# Patient Record
Sex: Female | Born: 1990 | Race: Black or African American | Hispanic: No | Marital: Married | Smoking: Current every day smoker
Health system: Southern US, Community
[De-identification: ages and names within clinical notes are randomized; demographics above are authoritative.]

## PROBLEM LIST (undated history)

## (undated) DIAGNOSIS — F341 Dysthymic disorder: Secondary | ICD-10-CM

## (undated) DIAGNOSIS — I10 Essential (primary) hypertension: Secondary | ICD-10-CM

## (undated) DIAGNOSIS — R918 Other nonspecific abnormal finding of lung field: Secondary | ICD-10-CM

## (undated) DIAGNOSIS — D649 Anemia, unspecified: Secondary | ICD-10-CM

## (undated) DIAGNOSIS — G43909 Migraine, unspecified, not intractable, without status migrainosus: Secondary | ICD-10-CM

## (undated) DIAGNOSIS — R51 Headache: Secondary | ICD-10-CM

## (undated) DIAGNOSIS — J309 Allergic rhinitis, unspecified: Secondary | ICD-10-CM

## (undated) DIAGNOSIS — F1910 Other psychoactive substance abuse, uncomplicated: Secondary | ICD-10-CM

## (undated) DIAGNOSIS — F319 Bipolar disorder, unspecified: Secondary | ICD-10-CM

## (undated) DIAGNOSIS — F331 Major depressive disorder, recurrent, moderate: Secondary | ICD-10-CM

## (undated) DIAGNOSIS — L83 Acanthosis nigricans: Secondary | ICD-10-CM

## (undated) DIAGNOSIS — J45991 Cough variant asthma: Secondary | ICD-10-CM

## (undated) DIAGNOSIS — F410 Panic disorder [episodic paroxysmal anxiety] without agoraphobia: Secondary | ICD-10-CM

## (undated) DIAGNOSIS — E669 Obesity, unspecified: Secondary | ICD-10-CM

## (undated) HISTORY — DX: Migraine, unspecified, not intractable, without status migrainosus: G43.909

## (undated) HISTORY — DX: Obesity, unspecified: E66.9

## (undated) HISTORY — DX: Other psychoactive substance abuse, uncomplicated: F19.10

## (undated) HISTORY — PX: TONSILLECTOMY: SUR1361

## (undated) HISTORY — DX: Headache: R51

## (undated) HISTORY — DX: Cough variant asthma: J45.991

## (undated) HISTORY — DX: Anemia, unspecified: D64.9

## (undated) HISTORY — DX: Bipolar disorder, unspecified: F31.9

## (undated) HISTORY — DX: Allergic rhinitis, unspecified: J30.9

## (undated) HISTORY — DX: Dysthymic disorder: F34.1

## (undated) HISTORY — DX: Major depressive disorder, recurrent, moderate: F33.1

## (undated) HISTORY — DX: Acanthosis nigricans: L83

## (undated) HISTORY — DX: Essential (primary) hypertension: I10

## (undated) HISTORY — DX: Other nonspecific abnormal finding of lung field: R91.8

## (undated) HISTORY — DX: Panic disorder (episodic paroxysmal anxiety): F41.0

---
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
## 2010-01-15 ENCOUNTER — Ambulatory Visit: Payer: Self-pay | Admitting: Family Medicine

## 2010-05-04 ENCOUNTER — Emergency Department: Payer: Self-pay | Admitting: Emergency Medicine

## 2010-06-03 ENCOUNTER — Emergency Department: Payer: Self-pay | Admitting: Emergency Medicine

## 2010-07-21 ENCOUNTER — Emergency Department: Payer: Self-pay | Admitting: Emergency Medicine

## 2010-10-30 ENCOUNTER — Emergency Department: Payer: Self-pay | Admitting: Emergency Medicine

## 2010-11-09 ENCOUNTER — Emergency Department: Payer: Self-pay | Admitting: Emergency Medicine

## 2011-02-11 ENCOUNTER — Ambulatory Visit: Payer: Self-pay

## 2011-09-21 ENCOUNTER — Emergency Department: Payer: Self-pay | Admitting: *Deleted

## 2011-09-21 ENCOUNTER — Emergency Department: Payer: Self-pay | Admitting: Emergency Medicine

## 2011-09-22 ENCOUNTER — Emergency Department: Payer: Self-pay | Admitting: Emergency Medicine

## 2012-01-15 ENCOUNTER — Emergency Department: Payer: Self-pay | Admitting: Emergency Medicine

## 2012-09-29 ENCOUNTER — Emergency Department: Payer: Self-pay | Admitting: Internal Medicine

## 2012-10-23 ENCOUNTER — Emergency Department: Payer: Self-pay | Admitting: Emergency Medicine

## 2012-11-11 ENCOUNTER — Emergency Department: Payer: Self-pay | Admitting: Emergency Medicine

## 2013-04-25 ENCOUNTER — Emergency Department: Payer: Self-pay | Admitting: Internal Medicine

## 2013-04-25 LAB — URINALYSIS, COMPLETE
BACTERIA: NONE SEEN
BILIRUBIN, UR: NEGATIVE
Glucose,UR: NEGATIVE mg/dL (ref 0–75)
Ketone: NEGATIVE
LEUKOCYTE ESTERASE: NEGATIVE
Nitrite: NEGATIVE
PROTEIN: NEGATIVE
Ph: 5 (ref 4.5–8.0)
Specific Gravity: 1.02 (ref 1.003–1.030)
Squamous Epithelial: 7
WBC UR: 2 /HPF (ref 0–5)

## 2013-04-25 LAB — GC/CHLAMYDIA PROBE AMP

## 2013-04-25 LAB — WET PREP, GENITAL

## 2013-08-28 ENCOUNTER — Emergency Department: Payer: Self-pay | Admitting: Emergency Medicine

## 2013-09-10 ENCOUNTER — Ambulatory Visit: Payer: Self-pay | Admitting: Cardiovascular Disease

## 2013-09-26 ENCOUNTER — Encounter: Payer: Self-pay | Admitting: *Deleted

## 2013-09-27 ENCOUNTER — Encounter (INDEPENDENT_AMBULATORY_CARE_PROVIDER_SITE_OTHER): Payer: Self-pay

## 2013-09-27 ENCOUNTER — Encounter: Payer: Self-pay | Admitting: Cardiovascular Disease

## 2013-09-27 ENCOUNTER — Ambulatory Visit (INDEPENDENT_AMBULATORY_CARE_PROVIDER_SITE_OTHER): Payer: Medicaid Other | Admitting: Cardiovascular Disease

## 2013-09-27 VITALS — BP 138/94 | HR 57 | Ht 64.0 in | Wt 281.2 lb

## 2013-09-27 DIAGNOSIS — R002 Palpitations: Secondary | ICD-10-CM | POA: Insufficient documentation

## 2013-09-27 NOTE — Assessment & Plan Note (Signed)
The symptoms are overall mild and could be due to premature beats. I requested a 48-hour Holter monitor. I advised her to decrease caffeine intake. If treatment of elevated blood pressure is needed, she might benefit from a beta blocker or calcium channel blocker for symptomatic relief.

## 2013-09-27 NOTE — Patient Instructions (Signed)
Your physician has recommended that you wear a holter monitor. Holter monitors are medical devices that record the heart's electrical activity. Doctors most often use these monitors to diagnose arrhythmias. Arrhythmias are problems with the speed or rhythm of the heartbeat. The monitor is a small, portable device. You can wear one while you do your normal daily activities. This is usually used to diagnose what is causing palpitations/syncope (passing out).   Your physician recommends that you schedule a follow-up appointment in:  As needed    

## 2013-09-27 NOTE — Progress Notes (Signed)
Primary care physician: Dr. Carlynn Purl  HPI  This is a 23 year old African American female who was referred for evaluation of palpitations. She has no previous cardiac history. She has chronic medical conditions that include elevated blood pressure without hypertension, obesity, depression and anxiety. She reports recent episodes of palpitations associated with mild dizziness but no significant tachycardia. These happened on a daily basis. She denies any previous syncopal episodes. She has occasional mild chest discomfort at rest and not with physical activities. She does drink significant amounts of soda and tea. She does not smoke. She does admit to using marijuana almost daily but no cocaine use. She drinks alcohol occasionally. There is no family history of sudden death, premature coronary artery disease or arrhythmia.     Allergies  Allergen Reactions  . Depakote [Divalproex Sodium]   . Tegretol [Carbamazepine]      No current outpatient prescriptions on file prior to visit.   No current facility-administered medications on file prior to visit.     Past Medical History  Diagnosis Date  . Other nonspecific abnormal finding of lung field   . Acquired acanthosis nigricans   . Dysthymic disorder   . Obesity, unspecified   . Headache(784.0)   . Essential hypertension, benign   . Bipolar disorder, unspecified   . Anemia, unspecified   . Migraine, unspecified, without mention of intractable migraine without mention of status migrainosus   . Allergic rhinitis, cause unspecified   . Cough variant asthma      Past Surgical History  Procedure Laterality Date  . Cesarean section    . Tonsillectomy       Family History  Problem Relation Age of Onset  . Hypertension Mother   . Leukemia Mother   . Diabetes Mother   . Heart murmur Mother   . Heart attack Mother   . Heart failure Mother      History   Social History  . Marital Status: Single    Spouse Name: N/A    Number  of Children: N/A  . Years of Education: N/A   Occupational History  . Not on file.   Social History Main Topics  . Smoking status: Never Smoker   . Smokeless tobacco: Not on file  . Alcohol Use: Yes  . Drug Use: Yes    Special: Marijuana  . Sexual Activity: Not on file   Other Topics Concern  . Not on file   Social History Narrative  . No narrative on file     ROS A 10 point review of system was performed. It is negative other than that mentioned in the history of present illness.   PHYSICAL EXAM   BP 138/94  Pulse 57  Ht 5\' 4"  (1.626 m)  Wt 281 lb 4 oz (127.574 kg)  BMI 48.25 kg/m2 Constitutional: She is oriented to person, place, and time. She appears well-developed and well-nourished. No distress.  HENT: No nasal discharge.  Head: Normocephalic and atraumatic.  Eyes: Pupils are equal and round. No discharge.  Neck: Normal range of motion. Neck supple. No JVD present. No thyromegaly present.  Cardiovascular: Normal rate, regular rhythm, normal heart sounds. Exam reveals no gallop and no friction rub. No murmur heard.  Pulmonary/Chest: Effort normal and breath sounds normal. No stridor. No respiratory distress. She has no wheezes. She has no rales. She exhibits no tenderness.  Abdominal: Soft. Bowel sounds are normal. She exhibits no distension. There is no tenderness. There is no rebound and no guarding.  Musculoskeletal:  Normal range of motion. She exhibits no edema and no tenderness.  Neurological: She is alert and oriented to person, place, and time. Coordination normal.  Skin: Skin is warm and dry. No rash noted. She is not diaphoretic. No erythema. No pallor.  Psychiatric: She has a normal mood and affect. Her behavior is normal. Judgment and thought content normal.     ZOX:WRUEAEKG:Sinus  Bradycardia  -with sinus arrhythmia WITHIN NORMAL LIMITS   ASSESSMENT AND PLAN

## 2013-10-08 ENCOUNTER — Telehealth: Payer: Self-pay | Admitting: *Deleted

## 2013-10-08 NOTE — Telephone Encounter (Signed)
Patient called and hasn't heard from [facility] for her heart monitor.

## 2013-10-10 NOTE — Telephone Encounter (Signed)
Patient states [facility] called her and she has appt to get monitor 10/11/13

## 2013-10-14 ENCOUNTER — Emergency Department: Payer: Self-pay | Admitting: Student

## 2013-10-14 LAB — COMPREHENSIVE METABOLIC PANEL
ALBUMIN: 3.6 g/dL (ref 3.4–5.0)
ALT: 15 U/L
AST: 24 U/L (ref 15–37)
Alkaline Phosphatase: 59 U/L
Anion Gap: 9 (ref 7–16)
BUN: 10 mg/dL (ref 7–18)
Bilirubin,Total: 0.3 mg/dL (ref 0.2–1.0)
CALCIUM: 8.7 mg/dL (ref 8.5–10.1)
CHLORIDE: 106 mmol/L (ref 98–107)
Co2: 25 mmol/L (ref 21–32)
Creatinine: 0.83 mg/dL (ref 0.60–1.30)
EGFR (Non-African Amer.): >60
GLUCOSE: 90 mg/dL (ref 65–99)
Osmolality: 278 (ref 275–301)
POTASSIUM: 3.6 mmol/L (ref 3.5–5.1)
Sodium: 140 mmol/L (ref 136–145)
TOTAL PROTEIN: 7.9 g/dL (ref 6.4–8.2)

## 2013-10-14 LAB — PROTIME-INR
INR: 1
PROTHROMBIN TIME: 12.7 s (ref 11.5–14.7)

## 2013-10-14 LAB — URINALYSIS, COMPLETE
Bacteria: NONE SEEN
Bilirubin,UR: NEGATIVE
Blood: NEGATIVE
Glucose,UR: NEGATIVE mg/dL (ref 0–75)
Ketone: NEGATIVE
Leukocyte Esterase: NEGATIVE
Nitrite: NEGATIVE
Ph: 5 (ref 4.5–8.0)
Protein: NEGATIVE
Specific Gravity: 1.006 (ref 1.003–1.030)

## 2013-10-14 LAB — CBC
HCT: 34.9 % — AB (ref 35.0–47.0)
HGB: 11.3 g/dL — ABNORMAL LOW (ref 12.0–16.0)
MCH: 25 pg — ABNORMAL LOW (ref 26.0–34.0)
MCHC: 32.4 g/dL (ref 32.0–36.0)
MCV: 77 fL — AB (ref 80–100)
Platelet: 334 10*3/uL (ref 150–440)
RBC: 4.53 10*6/uL (ref 3.80–5.20)
RDW: 16.8 % — ABNORMAL HIGH (ref 11.5–14.5)
WBC: 9.3 10*3/uL (ref 3.6–11.0)

## 2013-10-14 LAB — TROPONIN I

## 2013-10-14 LAB — PREGNANCY, URINE: Pregnancy Test, Urine: NEGATIVE m[IU]/mL

## 2013-10-14 LAB — CK TOTAL AND CKMB (NOT AT ARMC)
CK, Total: 117 U/L
CK-MB: <0.5 ng/mL — ABNORMAL LOW (ref 0.5–3.6)

## 2013-10-15 ENCOUNTER — Ambulatory Visit (INDEPENDENT_AMBULATORY_CARE_PROVIDER_SITE_OTHER): Payer: Self-pay | Admitting: Cardiovascular Disease

## 2013-10-15 ENCOUNTER — Encounter: Payer: Self-pay | Admitting: Cardiovascular Disease

## 2013-10-15 VITALS — BP 142/92 | HR 93 | Ht 64.0 in | Wt 277.2 lb

## 2013-10-15 DIAGNOSIS — R0602 Shortness of breath: Secondary | ICD-10-CM

## 2013-10-15 DIAGNOSIS — R002 Palpitations: Secondary | ICD-10-CM

## 2013-10-15 NOTE — Patient Instructions (Signed)
Your physician has requested that you have an echocardiogram. Echocardiography is a painless test that uses sound waves to create images of your heart. It provides your doctor with information about the size and shape of your heart and how well your heart's chambers and valves are working. This procedure takes approximately one hour. There are no restrictions for this procedure.  Please proceed with obtaining your holter monitor   Your physician recommends that you schedule a follow-up appointment in:  After your echo

## 2013-10-15 NOTE — Progress Notes (Signed)
Primary care physician: Dr. Carlynn PurlSowles  HPI  This is a 23 year old African American female who is here today for followup visit regarding palpitations. She has no previous cardiac history. She has chronic medical conditions that include elevated blood pressure without hypertension, obesity, depression and anxiety. She was seen recently for episodes of palpitations associated with mild dizziness but no significant tachycardia. No previous syncopal episodes. She also had mild atypical chest pain and exertional dyspnea. I requested a 48-hour Holter monitor but she did not keep the appointment. She had worsening palpitations yesterday and [facility]. Her palpitations have been after she used cocaine. Labs were unremarkable except for mild anemia. Chest x-ray showed no acute cardiopulmonary process.   Allergies  Allergen Reactions  . Depakote [Divalproex Sodium]   . Tegretol [Carbamazepine]      No current outpatient prescriptions on file prior to visit.   No current facility-administered medications on file prior to visit.     Past Medical History  Diagnosis Date  . Other nonspecific abnormal finding of lung field   . Acquired acanthosis nigricans   . Dysthymic disorder   . Obesity, unspecified   . Headache(784.0)   . Essential hypertension, benign   . Bipolar disorder, unspecified   . Anemia, unspecified   . Migraine, unspecified, without mention of intractable migraine without mention of status migrainosus   . Allergic rhinitis, cause unspecified   . Cough variant asthma      Past Surgical History  Procedure Laterality Date  . Cesarean section    . Tonsillectomy       Family History  Problem Relation Age of Onset  . Hypertension Mother   . Leukemia Mother   . Diabetes Mother   . Heart murmur Mother   . Heart attack Mother   . Heart failure Mother      History   Social History  . Marital Status: Single    Spouse Name: N/A    Number of  Children: N/A  . Years of Education: N/A   Occupational History  . Not on file.   Social History Main Topics  . Smoking status: Never Smoker   . Smokeless tobacco: Not on file  . Alcohol Use: No  . Drug Use: Yes    Special: Marijuana, Cocaine  . Sexual Activity: Not on file   Other Topics Concern  . Not on file   Social History Narrative  . No narrative on file     ROS A 10 point review of system was performed. It is negative other than that mentioned in the history of present illness.   PHYSICAL EXAM   BP 142/92  Pulse 93  Ht 5\' 4"  (1.626 m)  Wt 277 lb 4 oz (125.76 kg)  BMI 47.57 kg/m2 Constitutional: She is oriented to person, place, and time. She appears well-developed and well-nourished. No distress.  HENT: No nasal discharge.  Head: Normocephalic and atraumatic.  Eyes: Pupils are equal and round. No discharge.  Neck: Normal range of motion. Neck supple. No JVD present. No thyromegaly present.  Cardiovascular: Normal rate, regular rhythm, normal heart sounds. Exam reveals no gallop and no friction rub. No murmur heard.  Pulmonary/Chest: Effort normal and breath sounds normal. No stridor. No respiratory distress. She has no wheezes. She has no rales. She exhibits no tenderness.  Abdominal: Soft. Bowel sounds are normal. She exhibits no distension. There is no tenderness. There is no rebound and no guarding.  Musculoskeletal: Normal range of  motion. She exhibits no edema and no tenderness.  Neurological: She is alert and oriented to person, place, and time. Coordination normal.  Skin: Skin is warm and dry. No rash noted. She is not diaphoretic. No erythema. No pallor.  Psychiatric: She has a normal mood and affect. Her behavior is normal. Judgment and thought content normal.     WJX:BJYNW  Rhythm  -  Nonspecific T-abnormality  -Nondiagnostic for age.   ABNORMAL    ASSESSMENT AND PLAN

## 2013-10-16 DIAGNOSIS — R0602 Shortness of breath: Secondary | ICD-10-CM | POA: Insufficient documentation

## 2013-10-16 DIAGNOSIS — R002 Palpitations: Secondary | ICD-10-CM

## 2013-10-16 NOTE — Assessment & Plan Note (Addendum)
The most recent episode was in the setting of cocaine use. I had a prolonged discussion with the patient about the importance of avoiding recreational drugs especially cocaine. I reordered a 48-hour Holter monitor. Given elevated blood pressure, it might consider treatment with diltiazem.

## 2013-10-16 NOTE — Assessment & Plan Note (Signed)
The patient is complaining of worsening dyspnea and occasional chest discomfort. I requested an echocardiogram for evaluation.

## 2013-10-18 ENCOUNTER — Other Ambulatory Visit (INDEPENDENT_AMBULATORY_CARE_PROVIDER_SITE_OTHER): Payer: Self-pay

## 2013-10-18 ENCOUNTER — Other Ambulatory Visit: Payer: Self-pay

## 2013-10-18 DIAGNOSIS — R079 Chest pain, unspecified: Secondary | ICD-10-CM

## 2013-10-18 DIAGNOSIS — R0602 Shortness of breath: Secondary | ICD-10-CM

## 2013-10-18 DIAGNOSIS — I059 Rheumatic mitral valve disease, unspecified: Secondary | ICD-10-CM

## 2013-10-19 ENCOUNTER — Telehealth: Payer: Self-pay

## 2013-10-19 ENCOUNTER — Encounter: Payer: Self-pay | Admitting: *Deleted

## 2013-10-19 NOTE — Telephone Encounter (Signed)
Pt needs a note stating she was here on 10/18/2013 please fax to her school  [phone]

## 2013-10-19 NOTE — Telephone Encounter (Signed)
Faxed note as requested  LVM to inform patient

## 2013-10-22 ENCOUNTER — Other Ambulatory Visit: Payer: Self-pay

## 2013-10-22 ENCOUNTER — Telehealth: Payer: Self-pay | Admitting: *Deleted

## 2013-10-22 ENCOUNTER — Ambulatory Visit (INDEPENDENT_AMBULATORY_CARE_PROVIDER_SITE_OTHER): Payer: Self-pay

## 2013-10-22 DIAGNOSIS — R002 Palpitations: Secondary | ICD-10-CM

## 2013-10-22 NOTE — Telephone Encounter (Signed)
Informed patient that per Dr Kirke Corin her holter showed normal sinus rhythm with rare pvc's Dr. Kirke Corin stated it was all right to cancel her appt this week  Patient verbalized understanding  Instructed patient to call if she experiences worsening pvc's

## 2013-10-25 ENCOUNTER — Ambulatory Visit: Payer: Self-pay | Admitting: Cardiovascular Disease

## 2014-01-24 ENCOUNTER — Emergency Department: Payer: Self-pay | Admitting: Emergency Medicine

## 2014-01-31 ENCOUNTER — Emergency Department: Payer: Self-pay | Admitting: Emergency Medicine

## 2014-02-18 ENCOUNTER — Emergency Department: Payer: Self-pay | Admitting: Emergency Medicine

## 2014-02-21 LAB — BETA STREP CULTURE(ARMC)

## 2014-02-23 ENCOUNTER — Emergency Department: Payer: Self-pay | Admitting: Emergency Medicine

## 2014-05-27 ENCOUNTER — Emergency Department: Payer: Self-pay | Admitting: Emergency Medicine

## 2014-06-27 ENCOUNTER — Emergency Department: Admit: 2014-06-27 | Disposition: A | Discharge status: Home / Self Care | Payer: Self-pay | Admitting: Student

## 2014-08-15 ENCOUNTER — Ambulatory Visit: Payer: Medicaid Other | Admitting: Psychiatry

## 2014-09-03 ENCOUNTER — Encounter: Payer: Self-pay | Admitting: Family Medicine

## 2014-09-03 DIAGNOSIS — F410 Panic disorder [episodic paroxysmal anxiety] without agoraphobia: Secondary | ICD-10-CM | POA: Insufficient documentation

## 2014-09-03 DIAGNOSIS — F331 Major depressive disorder, recurrent, moderate: Secondary | ICD-10-CM | POA: Insufficient documentation

## 2014-09-03 DIAGNOSIS — Z8639 Personal history of other endocrine, nutritional and metabolic disease: Secondary | ICD-10-CM | POA: Insufficient documentation

## 2014-09-03 DIAGNOSIS — Z8679 Personal history of other diseases of the circulatory system: Secondary | ICD-10-CM | POA: Insufficient documentation

## 2014-09-05 ENCOUNTER — Ambulatory Visit: Payer: Self-pay | Admitting: Family Medicine

## 2014-09-10 ENCOUNTER — Other Ambulatory Visit: Payer: Self-pay | Admitting: Family Medicine

## 2014-09-10 NOTE — Telephone Encounter (Signed)
Was she dismissed from our practice?

## 2014-09-10 NOTE — Telephone Encounter (Signed)
Pt has no showed several appointments

## 2014-09-11 ENCOUNTER — Encounter: Payer: Self-pay | Admitting: Family Medicine

## 2014-09-24 ENCOUNTER — Encounter: Payer: Self-pay | Admitting: *Deleted

## 2014-09-24 ENCOUNTER — Emergency Department
Admission: EM | Admit: 2014-09-24 | Discharge: 2014-09-24 | Disposition: A | Discharge status: Home / Self Care | Payer: Medicaid Other | Attending: Emergency Medicine | Admitting: Emergency Medicine

## 2014-09-24 DIAGNOSIS — J029 Acute pharyngitis, unspecified: Secondary | ICD-10-CM | POA: Diagnosis not present

## 2014-09-24 DIAGNOSIS — Z79899 Other long term (current) drug therapy: Secondary | ICD-10-CM | POA: Insufficient documentation

## 2014-09-24 DIAGNOSIS — I10 Essential (primary) hypertension: Secondary | ICD-10-CM | POA: Diagnosis not present

## 2014-09-24 MED ORDER — AZITHROMYCIN 250 MG PO TABS: ORAL_TABLET | ORAL | Status: DC

## 2014-09-24 MED ORDER — ACETAMINOPHEN-CODEINE #3 300-30 MG PO TABS: 1.0000 | ORAL_TABLET | ORAL | Status: DC | PRN

## 2014-09-24 NOTE — Discharge Instructions (Signed)
Follow up with the primary care provider for symptoms that are not improving over the next few days. Return to the emergency department for symptoms that change or worsen if unable to schedule an appointment.

## 2014-09-24 NOTE — ED Provider Notes (Signed)
[facility] Emergency Department Provider Note  ____________________________________________  Time seen: Approximately 9:38 PM  I have reviewed the triage vital signs and the nursing notes.   HISTORY  Chief Complaint Sore Throat and Otalgia    HPI Jeanette Alexander is a 24 y.o. female presents to the emergency department for bilateral earache and sore throat for the last 8 days. She's had no relief with over-the-counter medications. She reports feeling chills but denies fever. She denies nausea vomiting or diarrhea.   Past Medical History  Diagnosis Date  . Other nonspecific abnormal finding of lung field   . Acquired acanthosis nigricans   . Dysthymic disorder   . Obesity, unspecified   . Headache(784.0)   . Essential hypertension, benign   . Bipolar disorder, unspecified   . Anemia, unspecified   . Migraine, unspecified, without mention of intractable migraine without mention of status migrainosus   . Allergic rhinitis, cause unspecified   . Cough variant asthma   . Panic disorder   . Major depressive disorder, recurrent episode, moderate     Patient Active Problem List   Diagnosis Date Noted  . H/O: HTN (hypertension) 09/03/2014  . Depression, major, recurrent, moderate 09/03/2014  . H/O: obesity 09/03/2014  . Episodic paroxysmal anxiety disorder 09/03/2014  . SOB (shortness of breath) 10/16/2013  . Heart palpitations 09/27/2013    Past Surgical History  Procedure Laterality Date  . Cesarean section    . Tonsillectomy      Current Outpatient Rx  Name  Route  Sig  Dispense  Refill  . acetaminophen-codeine (TYLENOL #3) 300-30 MG per tablet   Oral   Take 1-2 tablets by mouth every 4 (four) hours as needed for moderate pain.   12 tablet   0   . azithromycin (ZITHROMAX Z-PAK) 250 MG tablet      Take 2 tablets (500 mg) on  Day 1,  followed by 1 tablet (250 mg) once daily on Days 2 through 5.   6 each   0   . busPIRone (BUSPAR) 10 MG  tablet   Oral   Take 10 mg by mouth 3 (three) times daily.         . clonazePAM (KLONOPIN) 0.5 MG tablet   Oral   Take 0.5 mg by mouth 2 (two) times daily as needed for anxiety.         . hydrochlorothiazide (HYDRODIURIL) 12.5 MG tablet      TAKE 1 TABLET BY MOUTH IN THE MORNING FOR BLOOD PRESSURE   30 tablet   0   . metoprolol succinate (TOPROL-XL) 25 MG 24 hr tablet      TAKE 1 TABLET BY MOUTH EVERY DAY FOR BLOOD PRESSURE AND PALPITATIONS   30 tablet   0   . traZODone (DESYREL) 100 MG tablet   Oral   Take 50 mg by mouth at bedtime.           Allergies Depakote and Tegretol  Family History  Problem Relation Age of Onset  . Hypertension Mother   . Leukemia Mother   . Diabetes Mother   . Heart murmur Mother   . Heart attack Mother   . Heart failure Mother     Social History History  Substance Use Topics  . Smoking status: Never Smoker   . Smokeless tobacco: Not on file  . Alcohol Use: No    Review of Systems Constitutional:Feverno Eyes: No visual changes. ENT: Sore throat.yes, Difficulty Swallowing no Respiratory: Denies shortness  of breath. Gastrointestinal: No abdominal pain.  No nausea, no vomiting.  No diarrhea. Genitourinary: Negative for dysuria. Musculoskeletal:Generalized body aches: yes Skin: Rash: no  Neurological: Negative for headaches, focal weakness or numbness.  10-point ROS otherwise negative.  ____________________________________________   PHYSICAL EXAM:  VITAL SIGNS: ED Triage Vitals  Enc Vitals Group     BP 09/24/14 2126 120/51 mmHg     Pulse Rate 09/24/14 2126 88     Resp 09/24/14 2126 18     Temp 09/24/14 2126 98.4 F (36.9 C)     Temp Source 09/24/14 2126 Oral     SpO2 09/24/14 2126 99 %     Weight 09/24/14 2126 309 lb (140.161 kg)     Height 09/24/14 2126 5\' 5"  (1.651 m)     Head Cir --      Peak Flow --      Pain Score 09/24/14 2127 9     Pain Loc --      Pain Edu? --      Excl. in GC? --      Constitutional: Alert and oriented. Well appearing and in no acute distress. Eyes: Conjunctivae are normal. PERRL. EOMI. Head: Atraumatic. Nose: No congestion/rhinnorhea. Mouth/Throat: Mucous membranes are moist.  Oropharynx erythematous. Tonsils are absent. Neck: No stridor.  Lymphatic: Lymphadenopathy: no. Cardiovascular: Normal rate, regular rhythm. Good peripheral circulation. Respiratory: Normal respiratory effort. Lungs CTAB. Gastrointestinal: Soft and nontender. Musculoskeletal: No lower extremity tenderness nor edema.   Neurologic:  Normal speech and language. No gross focal neurologic deficits are appreciated. Speech is normal. No gait instability. Skin:  Skin is warm, dry and intact. No rash noted Psychiatric: Mood and affect are normal. Speech and behavior are normal.  ____________________________________________   LABS (all labs ordered are listed, but only abnormal results are displayed)  Labs Reviewed - No data to display ____________________________________________  EKG   ____________________________________________  RADIOLOGY  Not indicated ____________________________________________   PROCEDURES  Procedure(s) performed: None  Critical Care performed: No  ____________________________________________   INITIAL IMPRESSION / ASSESSMENT AND PLAN / ED COURSE  Pertinent labs & imaging results that were available during my care of the patient were reviewed by me and considered in my medical decision making (see chart for details).  Patient was advised to follow-up with primary care provider for symptoms that are not improving over the next 2-3 days. She was advised to return to emergency department for symptoms that change or worsen if she is unable schedule an appointment. ____________________________________________   FINAL CLINICAL IMPRESSION(S) / ED DIAGNOSES  Final diagnoses:  Acute pharyngitis, unspecified pharyngitis type      Chinita Pester, FNP 09/24/14 2330  Myrna Blazer, MD 09/28/14 2212

## 2014-09-24 NOTE — ED Notes (Signed)
Pt has sore throat and bil earache for 1 week.

## 2014-10-09 ENCOUNTER — Emergency Department
Admission: EM | Admit: 2014-10-09 | Discharge: 2014-10-09 | Disposition: A | Discharge status: Home / Self Care | Payer: Medicaid Other | Attending: Emergency Medicine | Admitting: Emergency Medicine

## 2014-10-09 DIAGNOSIS — Z79899 Other long term (current) drug therapy: Secondary | ICD-10-CM | POA: Insufficient documentation

## 2014-10-09 DIAGNOSIS — Z9089 Acquired absence of other organs: Secondary | ICD-10-CM | POA: Diagnosis not present

## 2014-10-09 DIAGNOSIS — J029 Acute pharyngitis, unspecified: Secondary | ICD-10-CM | POA: Diagnosis present

## 2014-10-09 DIAGNOSIS — I10 Essential (primary) hypertension: Secondary | ICD-10-CM | POA: Diagnosis not present

## 2014-10-09 LAB — POCT RAPID STREP A: STREPTOCOCCUS, GROUP A SCREEN (DIRECT): NEGATIVE

## 2014-10-09 MED ORDER — FLUCONAZOLE 150 MG PO TABS: ORAL_TABLET | ORAL | Status: DC

## 2014-10-09 MED ORDER — AMOXICILLIN 500 MG PO CAPS: 500.0000 mg | ORAL_CAPSULE | Freq: Two times a day (BID) | ORAL | Status: AC

## 2014-10-09 MED ORDER — IBUPROFEN 400 MG PO TABS
600.0000 mg | ORAL_TABLET | Freq: Once | ORAL | Status: DC
  Filled 2014-10-09: qty 2

## 2014-10-09 MED ORDER — AMOXICILLIN 500 MG PO CAPS
500.0000 mg | ORAL_CAPSULE | Freq: Once | ORAL | Status: AC
  Administered 2014-10-09: 500 mg via ORAL
  Filled 2014-10-09: qty 1

## 2014-10-09 MED ORDER — IBUPROFEN 800 MG PO TABS
800.0000 mg | ORAL_TABLET | Freq: Once | ORAL | Status: AC
  Administered 2014-10-09: 800 mg via ORAL

## 2014-10-09 NOTE — ED Provider Notes (Addendum)
[facility] Emergency Department Provider Note  ____________________________________________  Time seen: Approximately 10:21 PM  I have reviewed the triage vital signs and the nursing notes.   HISTORY  Chief Complaint Sore Throat    HPI Jeanette Alexander is a 24 y.o. female with multiple chronic medical problems include a psychiatric disease but no severe medical conditions other than morbid obesity who presents with recurrent sore throat.  She states that she had a sore throat about 3 weeks ago that was quite severe and lasted about 8 days.  She was seen in the emergency department and prescribed azithromycin after which the sore throat improved rapidly.  It was gone for a while but returned 3 days ago and feels "just like it did before".  She is not having any difficulty swallowing, just pain with swallowing.  She is not having any difficulty breathing, chest pain, shortness of breath, fever or chills, nausea or vomiting, or abdominal pain.  She has previously seen a physician in [facility] and had her tonsils removed for problems with sore throats in the past.  She does endorse some recent nasal congestion and frequent sneezing.   Past Medical History  Diagnosis Date  . Other nonspecific abnormal finding of lung field   . Acquired acanthosis nigricans   . Dysthymic disorder   . Obesity, unspecified   . Headache(784.0)   . Essential hypertension, benign   . Bipolar disorder, unspecified   . Anemia, unspecified   . Migraine, unspecified, without mention of intractable migraine without mention of status migrainosus   . Allergic rhinitis, cause unspecified   . Cough variant asthma   . Panic disorder   . Major depressive disorder, recurrent episode, moderate     Patient Active Problem List   Diagnosis Date Noted  . H/O: HTN (hypertension) 09/03/2014  . Depression, major, recurrent, moderate 09/03/2014  . H/O: obesity 09/03/2014  . Episodic paroxysmal  anxiety disorder 09/03/2014  . SOB (shortness of breath) 10/16/2013  . Heart palpitations 09/27/2013    Past Surgical History  Procedure Laterality Date  . Cesarean section    . Tonsillectomy      Current Outpatient Rx  Name  Route  Sig  Dispense  Refill  . acetaminophen-codeine (TYLENOL #3) 300-30 MG per tablet   Oral   Take 1-2 tablets by mouth every 4 (four) hours as needed for moderate pain.   12 tablet   0   .           .           . busPIRone (BUSPAR) 10 MG tablet   Oral   Take 10 mg by mouth 3 (three) times daily.         . clonazePAM (KLONOPIN) 0.5 MG tablet   Oral   Take 0.5 mg by mouth 2 (two) times daily as needed for anxiety.         . hydrochlorothiazide (HYDRODIURIL) 12.5 MG tablet      TAKE 1 TABLET BY MOUTH IN THE MORNING FOR BLOOD PRESSURE   30 tablet   0   . metoprolol succinate (TOPROL-XL) 25 MG 24 hr tablet      TAKE 1 TABLET BY MOUTH EVERY DAY FOR BLOOD PRESSURE AND PALPITATIONS   30 tablet   0   . traZODone (DESYREL) 100 MG tablet   Oral   Take 50 mg by mouth at bedtime.           Allergies Depakote and Tegretol  Family History  Problem Relation Age of Onset  . Hypertension Mother   . Leukemia Mother   . Diabetes Mother   . Heart murmur Mother   . Heart attack Mother   . Heart failure Mother     Social History Social History  Substance Use Topics  . Smoking status: Never Smoker   . Smokeless tobacco: Not on file  . Alcohol Use: No    Review of Systems Constitutional: No fever/chills Eyes: No visual changes. ENT: Severe sharp sore throat Cardiovascular: Denies chest pain. Respiratory: Denies shortness of breath. Gastrointestinal: No abdominal pain.  No nausea, no vomiting.  No diarrhea.  No constipation. Genitourinary: Negative for dysuria. Musculoskeletal: Negative for back pain. Skin: Negative for rash. Neurological: Negative for headaches, focal weakness or numbness.  10-point ROS otherwise  negative.  ____________________________________________   PHYSICAL EXAM:  VITAL SIGNS: ED Triage Vitals  Enc Vitals Group     BP 10/09/14 2158 103/58 mmHg     Pulse Rate 10/09/14 2158 69     Resp 10/09/14 2158 18     Temp 10/09/14 2158 98 F (36.7 C)     Temp Source 10/09/14 2158 Oral     SpO2 10/09/14 2158 99 %     Weight 10/09/14 2158 314 lb (142.429 kg)     Height 10/09/14 2158  (1.626 m)     Head Cir --      Peak Flow --      Pain Score 10/09/14 2157 8     Pain Loc --      Pain Edu? --      Excl. in GC? --     Constitutional: Alert and oriented. Well appearing and in no acute distress. Eyes: Conjunctivae are normal. PERRL. EOMI. Head: Atraumatic. Nose: No congestion/rhinnorhea. Mouth/Throat: Mucous membranes are moist.  Tonsils are surgically absent.  She has erythema of her posterior pharynx and some petechiae of the posterior pharynx as well.  There is no exudate or purulence. No muffled or hot potato voice. Neck: No stridor.  No tenderness to palpation of the thyroid cartilage/larynx. Hematological/Lymphatic/Immunilogical: No cervical lymphadenopathy Cardiovascular: Normal rate, regular rhythm. Grossly normal heart sounds.  Good peripheral circulation. Respiratory: Normal respiratory effort.  No retractions. Lungs CTAB. Gastrointestinal: Soft and nontender. No distention. No abdominal bruits. No CVA tenderness. Musculoskeletal: No lower extremity tenderness nor edema.  No joint effusions. Neurologic:  Normal speech and language. No gross focal neurologic deficits are appreciated.  Skin:  Skin is warm, dry and intact. No rash noted. Psychiatric: Mood and affect are normal. Speech and behavior are normal.  ____________________________________________   LABS (all labs ordered are listed, but only abnormal results are displayed)  Labs Reviewed  POCT RAPID STREP A   ____________________________________________  EKG  not  indicated ____________________________________________  RADIOLOGY  No results found.  ____________________________________________   PROCEDURES  Procedure(s) performed: None  Critical Care performed: No ____________________________________________   INITIAL IMPRESSION / ASSESSMENT AND PLAN / ED COURSE  Pertinent labs & imaging results that were available during my care of the patient were reviewed by me and considered in my medical decision making (see chart for details).  The patient is well-appearing with normal vital signs and is afebrile.  Her rapid strep was negative, but she does have recurrent sore throats, and she has erythema and petechiae of her palate.  There is no evidence on physical exam of a retropharyngeal abscess, peritonsillar abscess, or Ludwig's angina.  Given her pain, the improvement previously with  antibiotics, and her physical exam findings, I will give her an empiric dose of amoxicillin.  I suggested to her that she follow-up with an ENT doctor given her recurrent issues and she understands and agrees.  I gave my usual and customary return precautions.  ____________________________________________  FINAL CLINICAL IMPRESSION(S) / ED DIAGNOSES  Final diagnoses:  Acute pharyngitis, unspecified pharyngitis type      NEW MEDICATIONS STARTED DURING THIS VISIT:  New Prescriptions   AMOXICILLIN (AMOXIL) 500 MG CAPSULE    Take 1 capsule (500 mg total) by mouth 2 (two) times daily.   FLUCONAZOLE (DIFLUCAN) 150 MG TABLET    Take 1 tablet (150 mg) by mouth once for symptoms of a yeast infection.  If you continue to have symptoms in 3 days, take another tablet (150 mg) by mouth.     Loleta Rose, MD 10/09/14 2240  Addendum: At discharge, the patient told the nurse that she gets a yeast infection and return if she takes amoxicillin and ask for medication for treatment.  I wrote her a prescription for Diflucan 150 mg 2 tablets.  Loleta Rose, MD 10/09/14  2251

## 2014-10-09 NOTE — Discharge Instructions (Signed)

## 2014-10-09 NOTE — ED Notes (Signed)
Patient ambulatory to triage with steady gait, without difficulty or distress noted; pt reports seen here for sore throat, rx zpak but continues to have sore throat

## 2014-10-13 LAB — CULTURE, GROUP A STREP (THRC)

## 2014-11-12 ENCOUNTER — Emergency Department
Admission: EM | Admit: 2014-11-12 | Discharge: 2014-11-12 | Disposition: A | Discharge status: Home / Self Care | Payer: Medicaid Other | Attending: Emergency Medicine | Admitting: Emergency Medicine

## 2014-11-12 DIAGNOSIS — I10 Essential (primary) hypertension: Secondary | ICD-10-CM | POA: Insufficient documentation

## 2014-11-12 DIAGNOSIS — J029 Acute pharyngitis, unspecified: Secondary | ICD-10-CM

## 2014-11-12 MED ORDER — PREDNISONE 50 MG PO TABS: ORAL_TABLET | ORAL | Status: DC

## 2014-11-12 MED ORDER — AMOXICILLIN 500 MG PO CAPS: 500.0000 mg | ORAL_CAPSULE | Freq: Three times a day (TID) | ORAL | Status: DC

## 2014-11-12 NOTE — ED Notes (Signed)
Pt complains of a sore throat and voice being horse for 1 week, pt denies any other symptoms

## 2014-11-12 NOTE — ED Notes (Signed)
Pt c/o sore throat for the past 2 weeks. Denies fever.

## 2014-11-12 NOTE — ED Provider Notes (Signed)
[facility] Emergency Department Provider Note     Time seen: ----------------------------------------- 3:05 PM on 11/12/2014 -----------------------------------------    I have reviewed the triage vital signs and the nursing notes.   HISTORY  Chief Complaint Sore Throat    HPI Jeanette Alexander is a 24 y.o. female who presents ER for sore throat and hoarse forceful last week and recent bilateral ear pain. She states she saw her primary care doctor yesterday they did prescribe her anything for her symptoms. She denies any other complaints.   Past Medical History  Diagnosis Date  . Other nonspecific abnormal finding of lung field   . Acquired acanthosis nigricans   . Dysthymic disorder   . Obesity, unspecified   . Headache(784.0)   . Essential hypertension, benign   . Bipolar disorder, unspecified   . Anemia, unspecified   . Migraine, unspecified, without mention of intractable migraine without mention of status migrainosus   . Allergic rhinitis, cause unspecified   . Cough variant asthma   . Panic disorder   . Major depressive disorder, recurrent episode, moderate     Patient Active Problem List   Diagnosis Date Noted  . H/O: HTN (hypertension) 09/03/2014  . Depression, major, recurrent, moderate 09/03/2014  . H/O: obesity 09/03/2014  . Episodic paroxysmal anxiety disorder 09/03/2014  . SOB (shortness of breath) 10/16/2013  . Heart palpitations 09/27/2013    Past Surgical History  Procedure Laterality Date  . Cesarean section    . Tonsillectomy      Allergies Depakote and Tegretol  Social History Social History  Substance Use Topics  . Smoking status: Never Smoker   . Smokeless tobacco: None  . Alcohol Use: Yes     Comment: occassionally    Review of Systems Constitutional: Negative for fever. Eyes: Negative for visual changes. ENT: Positive for bilateral earache and sore throat. Cardiovascular: Negative for chest  pain. Respiratory: Negative for shortness of breath. Gastrointestinal: Negative for abdominal pain, vomiting and diarrhea. Genitourinary: Negative for dysuria. Musculoskeletal: Negative for back pain. Skin: Negative for rash. Neurological: Negative for headaches, focal weakness or numbness.  10-point ROS otherwise negative.  ____________________________________________   PHYSICAL EXAM:  VITAL SIGNS: ED Triage Vitals  Enc Vitals Group     BP 11/12/14 1402 124/62 mmHg     Pulse Rate 11/12/14 1400 94     Resp 11/12/14 1400 18     Temp 11/12/14 1400 98.3 F (36.8 C)     Temp Source 11/12/14 1400 Oral     SpO2 11/12/14 1402 97 %     Weight 11/12/14 1400 312 lb (141.522 kg)     Height 11/12/14 1400 5\' 5"  (1.651 m)     Head Cir --      Peak Flow --      Pain Score 11/12/14 1400 10     Pain Loc --      Pain Edu? --      Excl. in GC? --     Constitutional: Alert and oriented. Well appearing and in no distress. Eyes: Conjunctivae are normal. PERRL. Normal extraocular movements. ENT   Head: Normocephalic and atraumatic.   Nose: No congestion/rhinnorhea.   Mouth/Throat: Mucous membranes are moist.   Neck: No stridor. Cardiovascular: Normal rate, regular rhythm. Normal and symmetric distal pulses are present in all extremities. No murmurs, rubs, or gallops. Respiratory: Normal respiratory effort without tachypnea nor retractions. Breath sounds are clear and equal bilaterally. No wheezes/rales/rhonchi. Gastrointestinal: Soft and nontender. No distention. No abdominal  bruits.  Musculoskeletal: Nontender with normal range of motion in all extremities. No joint effusions.  No lower extremity tenderness nor edema. Neurologic:  Normal speech and language. No gross focal neurologic deficits are appreciated. Speech is normal. No gait instability. Skin:  Skin is warm, dry and intact. No rash noted. Psychiatric: Mood and affect are normal. Speech and behavior are normal. Patient  exhibits appropriate insight and judgment. ____________________________________________  ED COURSE:  Pertinent labs & imaging results that were available during my care of the patient were reviewed by me and considered in my medical decision making (see chart for details). Patient is in no acute distress, overall benign exam. Will perform rapid strep test ____________________________________________    LABS (pertinent positives/negatives)  Negative rapid strep test ____________________________________________  FINAL ASSESSMENT AND PLAN  Pharyngitis  Plan: Patient with labs and imaging as dictated above. Patient be discharged with steroid taper, amoxicillin. She is stable for outpatient follow-up   Emily Filbert, MD   Emily Filbert, MD 11/12/14 [phone]

## 2014-11-12 NOTE — Discharge Instructions (Signed)

## 2014-11-24 ENCOUNTER — Emergency Department
Admission: EM | Admit: 2014-11-24 | Discharge: 2014-11-24 | Disposition: A | Discharge status: Home / Self Care | Payer: Medicaid Other | Attending: Emergency Medicine | Admitting: Emergency Medicine

## 2014-11-24 ENCOUNTER — Emergency Department: Payer: Medicaid Other

## 2014-11-24 ENCOUNTER — Encounter: Payer: Self-pay | Admitting: *Deleted

## 2014-11-24 DIAGNOSIS — Y9389 Activity, other specified: Secondary | ICD-10-CM | POA: Insufficient documentation

## 2014-11-24 DIAGNOSIS — M25461 Effusion, right knee: Secondary | ICD-10-CM

## 2014-11-24 DIAGNOSIS — I10 Essential (primary) hypertension: Secondary | ICD-10-CM | POA: Insufficient documentation

## 2014-11-24 DIAGNOSIS — R05 Cough: Secondary | ICD-10-CM | POA: Diagnosis not present

## 2014-11-24 DIAGNOSIS — Y92002 Bathroom of unspecified non-institutional (private) residence single-family (private) house as the place of occurrence of the external cause: Secondary | ICD-10-CM | POA: Insufficient documentation

## 2014-11-24 DIAGNOSIS — R0981 Nasal congestion: Secondary | ICD-10-CM | POA: Insufficient documentation

## 2014-11-24 DIAGNOSIS — Z79899 Other long term (current) drug therapy: Secondary | ICD-10-CM | POA: Diagnosis not present

## 2014-11-24 DIAGNOSIS — F319 Bipolar disorder, unspecified: Secondary | ICD-10-CM | POA: Diagnosis not present

## 2014-11-24 DIAGNOSIS — S8991XA Unspecified injury of right lower leg, initial encounter: Secondary | ICD-10-CM | POA: Insufficient documentation

## 2014-11-24 DIAGNOSIS — Z792 Long term (current) use of antibiotics: Secondary | ICD-10-CM | POA: Insufficient documentation

## 2014-11-24 DIAGNOSIS — Y998 Other external cause status: Secondary | ICD-10-CM | POA: Insufficient documentation

## 2014-11-24 DIAGNOSIS — E669 Obesity, unspecified: Secondary | ICD-10-CM | POA: Insufficient documentation

## 2014-11-24 DIAGNOSIS — W010XXA Fall on same level from slipping, tripping and stumbling without subsequent striking against object, initial encounter: Secondary | ICD-10-CM | POA: Diagnosis not present

## 2014-11-24 DIAGNOSIS — R062 Wheezing: Secondary | ICD-10-CM | POA: Diagnosis not present

## 2014-11-24 DIAGNOSIS — W19XXXA Unspecified fall, initial encounter: Secondary | ICD-10-CM

## 2014-11-24 DIAGNOSIS — M25561 Pain in right knee: Secondary | ICD-10-CM

## 2014-11-24 MED ORDER — OXYCODONE-ACETAMINOPHEN 5-325 MG PO TABS
1.0000 | ORAL_TABLET | Freq: Once | ORAL | Status: AC
  Administered 2014-11-24: 1 via ORAL
  Filled 2014-11-24: qty 1

## 2014-11-24 MED ORDER — OXYCODONE-ACETAMINOPHEN 5-325 MG PO TABS: 1.0000 | ORAL_TABLET | ORAL | Status: DC | PRN

## 2014-11-24 MED ORDER — IBUPROFEN 800 MG PO TABS: 800.0000 mg | ORAL_TABLET | Freq: Three times a day (TID) | ORAL | Status: DC | PRN

## 2014-11-24 MED ORDER — IBUPROFEN 800 MG PO TABS
800.0000 mg | ORAL_TABLET | Freq: Once | ORAL | Status: AC
  Administered 2014-11-24: 800 mg via ORAL
  Filled 2014-11-24: qty 1

## 2014-11-24 NOTE — ED Notes (Signed)
Pt slipped on wet floor, falling and twisting R knee. Pt has taken no meds for pain at home. Pt is unable to bear weight on R knee.

## 2014-11-24 NOTE — Discharge Instructions (Signed)
1. Take pain medicines as needed (Motrin/Percocet #15). 2. Use knee immobilizer and crutches as needed. 3. Elevate affected area and apply ice several times daily to reduce swelling. 4. Return to the ER for worsening symptoms, increase swelling, difficult breathing or other concerns.  Fall Prevention and Home Safety Falls cause injuries and can affect all age groups. It is possible to prevent falls.  HOW TO PREVENT FALLS  Wear shoes with rubber soles that do not have an opening for your toes.  Keep the inside and outside of your house well lit.  Use night lights throughout your home.  Remove clutter from floors.  Clean up floor spills.  Remove throw rugs or fasten them to the floor with carpet tape.  Do not place electrical cords across pathways.  Put grab bars by your tub, shower, and toilet. Do not use towel bars as grab bars.  Put handrails on both sides of the stairway. Fix loose handrails.  Do not climb on stools or stepladders, if possible.  Do not wax your floors.  Repair uneven or unsafe sidewalks, walkways, or stairs.  Keep items you use a lot within reach.  Be aware of pets.  Keep emergency numbers next to the telephone.  Put smoke detectors in your home and near bedrooms. Ask your doctor what other things you can do to prevent falls. Document Released: 12/12/2008 Document Revised: 08/17/2011 Document Reviewed: 05/18/2011 [facility] Patient Information 2015 [place], [place]. This information is not intended to replace advice given to you by your health care provider. Make sure you discuss any questions you have with your health care provider.  Knee Pain The knee is the complex joint between your thigh and your lower leg. It is made up of bones, tendons, ligaments, and cartilage. The bones that make up the knee are:  The femur in the thigh.  The tibia and fibula in the lower leg.  The patella or kneecap riding in the groove on the lower femur. CAUSES  Knee  pain is a common complaint with many causes. A few of these causes are:  Injury, such as:  A ruptured ligament or tendon injury.  Torn cartilage.  Medical conditions, such as:  Gout  Arthritis  Infections  Overuse, over training, or overdoing a physical activity. Knee pain can be minor or severe. Knee pain can accompany debilitating injury. Minor knee problems often respond well to self-care measures or get well on their own. More serious injuries may need medical intervention or even surgery. SYMPTOMS The knee is complex. Symptoms of knee problems can vary widely. Some of the problems are:  Pain with movement and weight bearing.  Swelling and tenderness.  Buckling of the knee.  Inability to straighten or extend your knee.  Your knee locks and you cannot straighten it.  Warmth and redness with pain and fever.  Deformity or dislocation of the kneecap. DIAGNOSIS  Determining what is wrong may be very straight forward such as when there is an injury. It can also be challenging because of the complexity of the knee. Tests to make a diagnosis may include:  Your caregiver taking a history and doing a physical exam.  Routine X-rays can be used to rule out other problems. X-rays will not reveal a cartilage tear. Some injuries of the knee can be diagnosed by:  Arthroscopy a surgical technique by which a small video camera is inserted through tiny incisions on the sides of the knee. This procedure is used to examine and repair internal  knee joint problems. Tiny instruments can be used during arthroscopy to repair the torn knee cartilage (meniscus).  Arthrography is a radiology technique. A contrast liquid is directly injected into the knee joint. Internal structures of the knee joint then become visible on X-ray film.  An MRI scan is a non X-ray radiology procedure in which magnetic fields and a computer produce two- or three-dimensional images of the inside of the knee. Cartilage  tears are often visible using an MRI scanner. MRI scans have largely replaced arthrography in diagnosing cartilage tears of the knee.  Blood work.  Examination of the fluid that helps to lubricate the knee joint (synovial fluid). This is done by taking a sample out using a needle and a syringe. TREATMENT The treatment of knee problems depends on the cause. Some of these treatments are:  Depending on the injury, proper casting, splinting, surgery, or physical therapy care will be needed.  Give yourself adequate recovery time. Do not overuse your joints. If you begin to get sore during workout routines, back off. Slow down or do fewer repetitions.  For repetitive activities such as cycling or running, maintain your strength and nutrition.  Alternate muscle groups. For example, if you are a weight lifter, work the upper body on one day and the lower body the next.  Either tight or weak muscles do not give the proper support for your knee. Tight or weak muscles do not absorb the stress placed on the knee joint. Keep the muscles surrounding the knee strong.  Take care of mechanical problems.  If you have flat feet, orthotics or special shoes may help. See your caregiver if you need help.  Arch supports, sometimes with wedges on the inner or outer aspect of the heel, can help. These can shift pressure away from the side of the knee most bothered by osteoarthritis.  A brace called an "unloader" brace also may be used to help ease the pressure on the most arthritic side of the knee.  If your caregiver has prescribed crutches, braces, wraps or ice, use as directed. The acronym for this is PRICE. This means protection, rest, ice, compression, and elevation.  Nonsteroidal anti-inflammatory drugs (NSAIDs), can help relieve pain. But if taken immediately after an injury, they may actually increase swelling. Take NSAIDs with food in your stomach. Stop them if you develop stomach problems. Do not take  these if you have a history of ulcers, stomach pain, or bleeding from the bowel. Do not take without your caregiver's approval if you have problems with fluid retention, heart failure, or kidney problems.  For ongoing knee problems, physical therapy may be helpful.  Glucosamine and chondroitin are over-the-counter dietary supplements. Both may help relieve the pain of osteoarthritis in the knee. These medicines are different from the usual anti-inflammatory drugs. Glucosamine may decrease the rate of cartilage destruction.  Injections of a corticosteroid drug into your knee joint may help reduce the symptoms of an arthritis flare-up. They may provide pain relief that lasts a few months. You may have to wait a few months between injections. The injections do have a small increased risk of infection, water retention, and elevated blood sugar levels.  Hyaluronic acid injected into damaged joints may ease pain and provide lubrication. These injections may work by reducing inflammation. A series of shots may give relief for as long as 6 months.  Topical painkillers. Applying certain ointments to your skin may help relieve the pain and stiffness of osteoarthritis. Ask your pharmacist for  suggestions. Many over the-counter products are approved for temporary relief of arthritis pain.  In some countries, doctors often prescribe topical NSAIDs for relief of chronic conditions such as arthritis and tendinitis. A review of treatment with NSAID creams found that they worked as well as oral medications but without the serious side effects. PREVENTION  Maintain a healthy weight. Extra pounds put more strain on your joints.  Get strong, stay limber. Weak muscles are a common cause of knee injuries. Stretching is important. Include flexibility exercises in your workouts.  Be smart about exercise. If you have osteoarthritis, chronic knee pain or recurring injuries, you may need to change the way you exercise. This  does not mean you have to stop being active. If your knees ache after jogging or playing basketball, consider switching to swimming, water aerobics, or other low-impact activities, at least for a few days a week. Sometimes limiting high-impact activities will provide relief.  Make sure your shoes fit well. Choose footwear that is right for your sport.  Protect your knees. Use the proper gear for knee-sensitive activities. Use kneepads when playing volleyball or laying carpet. Buckle your seat belt every time you drive. Most shattered kneecaps occur in car accidents.  Rest when you are tired. SEEK MEDICAL CARE IF:  You have knee pain that is continual and does not seem to be getting better.  SEEK IMMEDIATE MEDICAL CARE IF:  Your knee joint feels hot to the touch and you have a high fever. MAKE SURE YOU:   Understand these instructions.  Will watch your condition.  Will get help right away if you are not doing well or get worse. Document Released: 12/13/2006 Document Revised: 05/10/2011 Document Reviewed: 12/13/2006 [facility] Patient Information 2015 [place], [place]. This information is not intended to replace advice given to you by your health care provider. Make sure you discuss any questions you have with your health care provider.  Knee Effusion  Knee effusion means you have fluid in your knee. The knee may be more difficult to bend and move. HOME CARE  Use crutches or a brace as told by your doctor.  Put ice on the injured area.  Put ice in a plastic bag.  Place a towel between your skin and the bag.  Leave the ice on for 15-20 minutes, 03-04 times a day.  Raise (elevate) your knee as much as possible.  Only take medicine as told by your doctor.  You may need to do strengthening exercises. Ask your doctor.  Continue with your normal diet and activities as told by your doctor. GET HELP RIGHT AWAY IF:  You have more puffiness (swelling) in your knee.  You see redness,  puffiness, or have more pain in your knee.  You have a temperature by mouth above 102 F (38.9 C).  You get a rash.  You have trouble breathing.  You have a reaction to any medicine you are taking.  You have a lot of pain when you move your knee. MAKE SURE YOU:  Understand these instructions.  Will watch your condition.  Will get help right away if you are not doing well or get worse. Document Released: 03/20/2010 Document Revised: 05/10/2011 Document Reviewed: 03/20/2010 [facility] Patient Information 2015 [place], [place]. This information is not intended to replace advice given to you by your health care provider. Make sure you discuss any questions you have with your health care provider.

## 2014-11-24 NOTE — ED Provider Notes (Signed)
[facility] Emergency Department Provider Note  ____________________________________________  Time seen: Approximately 6:46 AM  I have reviewed the triage vital signs and the nursing notes.   HISTORY  Chief Complaint Knee Pain    HPI Jeanette Alexander is a 24 y.o. female who presents to the ED from home s/p mechanical fall onto right knee. Patient slipped in mud and fell onto right knee, twisting it. Denies striking head or LOC. Patient fell again while attempting to ambulate to the bathroom, striking right knee again. Denies headache, neck pain, chest pain, shortness of breath, fevers, chills, abdominal pain, nausea, vomiting, diarrhea. Nothing makes the pain better. Movement makes the pain worse.   Past Medical History  Diagnosis Date  . Other nonspecific abnormal finding of lung field   . Acquired acanthosis nigricans   . Dysthymic disorder   . Obesity, unspecified   . Headache(784.0)   . Essential hypertension, benign   . Bipolar disorder, unspecified   . Anemia, unspecified   . Migraine, unspecified, without mention of intractable migraine without mention of status migrainosus   . Allergic rhinitis, cause unspecified   . Cough variant asthma   . Panic disorder   . Major depressive disorder, recurrent episode, moderate     Patient Active Problem List   Diagnosis Date Noted  . H/O: HTN (hypertension) 09/03/2014  . Depression, major, recurrent, moderate 09/03/2014  . H/O: obesity 09/03/2014  . Episodic paroxysmal anxiety disorder 09/03/2014  . SOB (shortness of breath) 10/16/2013  . Heart palpitations 09/27/2013    Past Surgical History  Procedure Laterality Date  . Cesarean section    . Tonsillectomy      Current Outpatient Rx  Name  Route  Sig  Dispense  Refill  . FLUoxetine (PROZAC) 20 MG tablet   Oral   Take 20 mg by mouth daily.         Marland Kitchen acetaminophen-codeine (TYLENOL #3) 300-30 MG per tablet   Oral   Take 1-2 tablets by mouth  every 4 (four) hours as needed for moderate pain.   12 tablet   0   . amoxicillin (AMOXIL) 500 MG capsule   Oral   Take 1 capsule (500 mg total) by mouth 3 (three) times daily.   21 capsule   0   . azithromycin (ZITHROMAX Z-PAK) 250 MG tablet      Take 2 tablets (500 mg) on  Day 1,  followed by 1 tablet (250 mg) once daily on Days 2 through 5.   6 each   0   . busPIRone (BUSPAR) 10 MG tablet   Oral   Take 10 mg by mouth 3 (three) times daily.         . clonazePAM (KLONOPIN) 0.5 MG tablet   Oral   Take 0.5 mg by mouth 2 (two) times daily as needed for anxiety.         . fluconazole (DIFLUCAN) 150 MG tablet      Take 1 tablet (150 mg) by mouth once for symptoms of a yeast infection.  If you continue to have symptoms in 3 days, take another tablet (150 mg) by mouth.   2 tablet   0   . hydrochlorothiazide (HYDRODIURIL) 12.5 MG tablet      TAKE 1 TABLET BY MOUTH IN THE MORNING FOR BLOOD PRESSURE   30 tablet   0   . metoprolol succinate (TOPROL-XL) 25 MG 24 hr tablet      TAKE 1 TABLET BY MOUTH  EVERY DAY FOR BLOOD PRESSURE AND PALPITATIONS   30 tablet   0   . predniSONE (DELTASONE) 50 MG tablet      One tablet by mouth daily   5 tablet   0   . traZODone (DESYREL) 100 MG tablet   Oral   Take 50 mg by mouth at bedtime.           Allergies Depakote and Tegretol  Family History  Problem Relation Age of Onset  . Hypertension Mother   . Leukemia Mother   . Diabetes Mother   . Heart murmur Mother   . Heart attack Mother   . Heart failure Mother     Social History Social History  Substance Use Topics  . Smoking status: Never Smoker   . Smokeless tobacco: None  . Alcohol Use: Yes     Comment: occassionally    Review of Systems Constitutional: No fever/chills Eyes: No visual changes. ENT: No sore throat. Cardiovascular: Denies chest pain. Respiratory: Positive for recent cough, congestion, wheezing. Seen in ED and treated with antibiotics and  steroids. Denies shortness of breath. Gastrointestinal: No abdominal pain.  No nausea, no vomiting.  No diarrhea.  No constipation. Genitourinary: Negative for dysuria. Musculoskeletal: Positive for right knee pain. Negative for back pain. Skin: Negative for rash. Neurological: Negative for headaches, focal weakness or numbness.  10-point ROS otherwise negative.  ____________________________________________   PHYSICAL EXAM:  VITAL SIGNS: ED Triage Vitals  Enc Vitals Group     BP 11/24/14 0459 136/79 mmHg     Pulse Rate 11/24/14 0459 82     Resp 11/24/14 0459 20     Temp 11/24/14 0459 98.5 F (36.9 C)     Temp Source 11/24/14 0459 Oral     SpO2 11/24/14 0459 94 %     Weight 11/24/14 0459 311 lb (141.069 kg)     Height 11/24/14 0459  (1.626 m)     Head Cir --      Peak Flow --      Pain Score 11/24/14 0501 10     Pain Loc --      Pain Edu? --      Excl. in GC? --     Constitutional: Alert and oriented. Well appearing and in no acute distress. Eyes: Conjunctivae are normal. PERRL. EOMI. Head: Atraumatic. Nose: No congestion/rhinnorhea. Mouth/Throat: Mucous membranes are moist.  Oropharynx non-erythematous. Neck: No stridor. No cervical spine tenderness to palpation. Cardiovascular: Normal rate, regular rhythm. Grossly normal heart sounds.  Good peripheral circulation. Respiratory: Normal respiratory effort.  No retractions. Lungs with occasional scattered wheeze. Gastrointestinal: Obese. Soft and nontender. No distention. No abdominal bruits. No CVA tenderness. Musculoskeletal: Right anterior knee tender to palpation with limited range of motion secondary to pain. Mild effusion noted. 2+ femoral, popliteal and distal pulses. Calf is soft and supple without evidence for compartment syndrome. Brisk, less than 5 second capillary refill. Limb is symmetrically warm without evidence for ischemia. Neurologic:  Normal speech and language. No gross focal neurologic deficits are  appreciated. No gait instability. Skin:  Skin is warm, dry and intact. No rash noted. Psychiatric: Mood and affect are normal. Speech and behavior are normal.  ____________________________________________   LABS (all labs ordered are listed, but only abnormal results are displayed)  Labs Reviewed - No data to display ____________________________________________  EKG  None ____________________________________________  RADIOLOGY  Right knee xrays (viewed by me, interpreted per Dr. Andria Meuse): Small left knee effusion. Probable small osteochondral defect in  the lateral femoral condyle. No acute displaced fractures identified.  Note: dictation reads left knee effusion; patient's x-rays were taken of her right knee and her right knee is the site of injury. ____________________________________________   PROCEDURES  Procedure(s) performed: None  Critical Care performed: No  ____________________________________________   INITIAL IMPRESSION / ASSESSMENT AND PLAN / ED COURSE  Pertinent labs & imaging results that were available during my care of the patient were reviewed by me and considered in my medical decision making (see chart for details).  24 year old female s/p fall presenting with right knee pain and effusion. Will treat with NSAIDs and analgesia; knee immobilizer, crutches and follow-up with orthopedics. Strict return precautions given. Patient verbalizes understanding and agrees with plan of care. ____________________________________________   FINAL CLINICAL IMPRESSION(S) / ED DIAGNOSES  Final diagnoses:  Fall, initial encounter  Knee pain, acute, right  Knee effusion, right      Irean Hong, MD 11/24/14 [id number]

## 2015-03-16 ENCOUNTER — Emergency Department: Payer: Medicaid Other

## 2015-03-16 ENCOUNTER — Encounter: Payer: Self-pay | Admitting: Emergency Medicine

## 2015-03-16 ENCOUNTER — Emergency Department
Admission: EM | Admit: 2015-03-16 | Discharge: 2015-03-16 | Disposition: A | Discharge status: Home / Self Care | Payer: Medicaid Other | Attending: Emergency Medicine | Admitting: Emergency Medicine

## 2015-03-16 DIAGNOSIS — Z7952 Long term (current) use of systemic steroids: Secondary | ICD-10-CM | POA: Diagnosis not present

## 2015-03-16 DIAGNOSIS — S8992XA Unspecified injury of left lower leg, initial encounter: Secondary | ICD-10-CM | POA: Diagnosis present

## 2015-03-16 DIAGNOSIS — Y9389 Activity, other specified: Secondary | ICD-10-CM | POA: Insufficient documentation

## 2015-03-16 DIAGNOSIS — Z79899 Other long term (current) drug therapy: Secondary | ICD-10-CM | POA: Insufficient documentation

## 2015-03-16 DIAGNOSIS — S93402A Sprain of unspecified ligament of left ankle, initial encounter: Secondary | ICD-10-CM | POA: Diagnosis not present

## 2015-03-16 DIAGNOSIS — I10 Essential (primary) hypertension: Secondary | ICD-10-CM | POA: Insufficient documentation

## 2015-03-16 DIAGNOSIS — K0889 Other specified disorders of teeth and supporting structures: Secondary | ICD-10-CM

## 2015-03-16 DIAGNOSIS — Y92410 Unspecified street and highway as the place of occurrence of the external cause: Secondary | ICD-10-CM | POA: Insufficient documentation

## 2015-03-16 DIAGNOSIS — Y998 Other external cause status: Secondary | ICD-10-CM | POA: Diagnosis not present

## 2015-03-16 DIAGNOSIS — S0993XA Unspecified injury of face, initial encounter: Secondary | ICD-10-CM | POA: Insufficient documentation

## 2015-03-16 DIAGNOSIS — Z792 Long term (current) use of antibiotics: Secondary | ICD-10-CM | POA: Diagnosis not present

## 2015-03-16 DIAGNOSIS — S8392XA Sprain of unspecified site of left knee, initial encounter: Secondary | ICD-10-CM | POA: Diagnosis not present

## 2015-03-16 MED ORDER — OXYCODONE-ACETAMINOPHEN 5-325 MG PO TABS
1.0000 | ORAL_TABLET | Freq: Once | ORAL | Status: AC
  Administered 2015-03-16: 1 via ORAL
  Filled 2015-03-16: qty 1

## 2015-03-16 MED ORDER — OXYCODONE-ACETAMINOPHEN 5-325 MG PO TABS: 1.0000 | ORAL_TABLET | Freq: Four times a day (QID) | ORAL | Status: DC | PRN

## 2015-03-16 NOTE — ED Notes (Signed)
Pt taken for CT and xray 
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
## 2015-03-16 NOTE — ED Notes (Signed)
EDP at bedside  
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
## 2015-03-16 NOTE — ED Notes (Signed)
Pt's abrasion on right elbow irrigated with sterile saline and dressed with non-adherent dressing and gauze.
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
## 2015-03-16 NOTE — Discharge Instructions (Signed)
Did not bite into apples or anything that could damage your teeth, follow closely with a dentist. Take your medications as prescribed. Follow closely with orthopedic surgery. If you have increased pain in your head face neck or limbs or you feel worse in any way return to the emergency room. Keep the abrasion clean and dry and use bacitracin ointment on it. Return for any sign of infection.  OPTIONS FOR DENTAL FOLLOW UP CARE  [place] Department of Health and Human Services - Local Safety Net Dental Clinics [email or web address]://www.ncdhhs.gov/dph/oralhealth/services/safetynetclinics.htm   [facility] [phone])  [facility] [phone])  [facility] [phone])  [facility] [phone])  [facility] [phone]) This clinic caters to the indigent population and is on a lottery system. Location: [employer], [address], [address], [place] [facility] Hours: Wednesdays from 6pm - 9pm, patients seen by a lottery system. For dates, call or go to [email or web address] Services: Cleanings, fillings and simple extractions. Payment Options: DENTAL WORK IS FREE OF CHARGE. Bring proof of income or support. Best way to get seen: Arrive at 5:15 pm - this is a lottery, NOT first come/first serve, so arriving earlier will not increase your chances of being seen.     [facility] [phone] Select option 1 for emergencies   Location: [facility], [address], [address], [place] [facility] Hours: No walk-ins accepted - call the day before to schedule an appointment. Check in times are 9:30 am and 1:30 pm. Services: Simple extractions, temporary fillings, pulpectomy/pulp debridement, uncomplicated abscess drainage. Payment Options: PAYMENT IS DUE AT THE TIME OF SERVICE.  Fee is usually $100-200, additional surgical procedures (e.g. abscess  drainage) may be extra. Cash, checks, Visa/MasterCard accepted.  Can file Medicaid if patient is covered for dental - patient should call case worker to check. No discount for [facility] patients. Best way to get seen: MUST call the day before and get onto the schedule. Can usually be seen the next 1-2 days. No walk-ins accepted.     [facility] [phone]   Location: [facility], [address], [place] [facility] Hours: M, W, Th, F 8am or 1:30pm, Tues 9a or 1:30 - first come/first served. Services: Simple extractions, temporary fillings, uncomplicated abscess drainage.  You do not need to be an [facility] [place] [place] resident. Payment Options: PAYMENT IS DUE AT THE TIME OF SERVICE. Dental insurance, otherwise sliding scale - bring proof of income or support. Depending on income and treatment needed, cost is usually $50-200. Best way to get seen: Arrive early as it is first come/first served.     [facility] [phone]   Location: [address] Clinic Hours: Mon-Thu 8a-5p Services: Most basic dental services including extractions and fillings. Payment Options: PAYMENT IS DUE AT THE TIME OF SERVICE. Sliding scale, up to 50% off - bring proof if income or support. Medicaid with dental option accepted. Best way to get seen: Call to schedule an appointment, can usually be seen within 2 weeks OR they will try to see walk-ins - show up at 8a or 2p (you may have to wait).     [facility] [phone] [place] RESIDENTS ONLY   Location: [facility], [address], [place], [place] [address] Clinic Hours: By appointment only. Monday - Thursday 8am-5pm, Friday 8am-12pm Services: Cleanings, fillings, extractions. Payment Options: PAYMENT IS DUE AT THE TIME  OF SERVICE. Cash, Visa or MasterCard. Sliding scale - $30 minimum per service. Best way to get  seen: Come in to office, complete packet and make an appointment - need proof of income or support monies for each household member and proof of [facility] residence. Usually takes about a month to get in.     [facility] [phone]   Location: [address] [facility] Hours: Walk-in Urgent Care Dental Services are offered Monday-Friday mornings only. The numbers of emergencies accepted daily is limited to the number of providers available. Maximum 15 - Mondays, Wednesdays & Thursdays Maximum 10 - Tuesdays & Fridays Services: You do not need to be a [facility] resident to be seen for a dental emergency. Emergencies are defined as pain, swelling, abnormal bleeding, or dental trauma. Walkins will receive x-rays if needed. NOTE: Dental cleaning is not an emergency. Payment Options: PAYMENT IS DUE AT THE TIME OF SERVICE. Minimum co-pay is $40.00 for uninsured patients. Minimum co-pay is $3.00 for Medicaid with dental coverage. Dental Insurance is accepted and must be presented at time of visit. Medicare does not cover dental. Forms of payment: Cash, credit card, checks. Best way to get seen: If not previously registered with the clinic, walk-in dental registration begins at 7:15 am and is on a first come/first serve basis. If previously registered with the clinic, call to make an appointment.     [facility] [phone] [place] RESIDENTS ONLY   Location: [address], [place], [place] Clinic Hours: Mon-Thu 10a-2p Services: Extractions only! Payment Options: FREE (donations accepted) - bring proof of income or support Best way to get seen: Call and schedule an appointment OR come at 8am on the 1st Monday of every month (except for holidays) when it is first come/first served.     [facility] [phone]   Location: [address] [place], [place] Clinic Hours: Friday mornings Services, Payment Options, Best  way to get seen: Call for info

## 2015-07-26 ENCOUNTER — Emergency Department: Payer: Medicaid Other

## 2015-07-26 ENCOUNTER — Encounter: Payer: Self-pay | Admitting: Emergency Medicine

## 2015-07-26 DIAGNOSIS — R002 Palpitations: Secondary | ICD-10-CM | POA: Diagnosis present

## 2015-07-26 DIAGNOSIS — R0789 Other chest pain: Secondary | ICD-10-CM | POA: Insufficient documentation

## 2015-07-26 DIAGNOSIS — Z5321 Procedure and treatment not carried out due to patient leaving prior to being seen by health care provider: Secondary | ICD-10-CM | POA: Diagnosis not present

## 2015-07-26 LAB — BASIC METABOLIC PANEL
ANION GAP: 5 (ref 5–15)
BUN: 13 mg/dL (ref 6–20)
CHLORIDE: 105 mmol/L (ref 101–111)
CO2: 28 mmol/L (ref 22–32)
Calcium: 9 mg/dL (ref 8.9–10.3)
Creatinine, Ser: 0.7 mg/dL (ref 0.44–1.00)
GFR calc non Af Amer: >60 mL/min (ref >60)
Glucose, Bld: 89 mg/dL (ref 65–99)
POTASSIUM: 3.8 mmol/L (ref 3.5–5.1)
SODIUM: 138 mmol/L (ref 135–145)

## 2015-07-26 LAB — CBC
HEMATOCRIT: 33 % — AB (ref 35.0–47.0)
HEMOGLOBIN: 10.7 g/dL — AB (ref 12.0–16.0)
MCH: 23.7 pg — ABNORMAL LOW (ref 26.0–34.0)
MCHC: 32.6 g/dL (ref 32.0–36.0)
MCV: 72.9 fL — AB (ref 80.0–100.0)
Platelets: 330 10*3/uL (ref 150–440)
RBC: 4.52 MIL/uL (ref 3.80–5.20)
RDW: 18.4 % — ABNORMAL HIGH (ref 11.5–14.5)
WBC: 7.5 10*3/uL (ref 3.6–11.0)

## 2015-07-26 LAB — TROPONIN I: Troponin I: <0.03 ng/mL (ref <0.031)

## 2015-07-26 NOTE — ED Notes (Signed)
Pt states her heart has been "jumping around" causing chest pain. States pain in the past but "not like this"

## 2015-07-27 ENCOUNTER — Emergency Department
Admission: EM | Admit: 2015-07-27 | Discharge: 2015-07-27 | Disposition: A | Discharge status: Home / Self Care | Payer: Medicaid Other | Attending: Emergency Medicine | Admitting: Emergency Medicine

## 2015-07-29 ENCOUNTER — Telehealth: Payer: Self-pay | Admitting: Emergency Medicine

## 2015-07-29 NOTE — ED Notes (Signed)
Called patient due to lwot to inquire about condition and follow up plans. Left message.   

## 2015-09-04 DIAGNOSIS — J029 Acute pharyngitis, unspecified: Secondary | ICD-10-CM | POA: Insufficient documentation

## 2015-09-04 DIAGNOSIS — Z5321 Procedure and treatment not carried out due to patient leaving prior to being seen by health care provider: Secondary | ICD-10-CM | POA: Insufficient documentation

## 2015-09-04 DIAGNOSIS — I10 Essential (primary) hypertension: Secondary | ICD-10-CM | POA: Diagnosis not present

## 2015-09-04 DIAGNOSIS — F319 Bipolar disorder, unspecified: Secondary | ICD-10-CM | POA: Diagnosis not present

## 2015-09-05 ENCOUNTER — Emergency Department
Admission: EM | Admit: 2015-09-05 | Discharge: 2015-09-05 | Disposition: A | Discharge status: Home / Self Care | Payer: Medicaid Other | Attending: Emergency Medicine | Admitting: Emergency Medicine

## 2015-09-05 LAB — POCT RAPID STREP A: Streptococcus, Group A Screen (Direct): NEGATIVE

## 2015-09-05 NOTE — ED Notes (Signed)
Strep test neg unable to log it into glucometer.

## 2015-09-05 NOTE — ED Notes (Signed)
Pt in with co sore throat x 1 week

## 2015-09-07 LAB — CULTURE, GROUP A STREP (THRC)

## 2015-10-09 ENCOUNTER — Encounter: Payer: Self-pay | Admitting: Emergency Medicine

## 2015-10-09 ENCOUNTER — Emergency Department
Admission: EM | Admit: 2015-10-09 | Discharge: 2015-10-09 | Disposition: A | Discharge status: Home / Self Care | Payer: Medicaid Other | Attending: Emergency Medicine | Admitting: Emergency Medicine

## 2015-10-09 DIAGNOSIS — Z791 Long term (current) use of non-steroidal anti-inflammatories (NSAID): Secondary | ICD-10-CM | POA: Diagnosis not present

## 2015-10-09 DIAGNOSIS — Z7952 Long term (current) use of systemic steroids: Secondary | ICD-10-CM | POA: Diagnosis not present

## 2015-10-09 DIAGNOSIS — I10 Essential (primary) hypertension: Secondary | ICD-10-CM | POA: Insufficient documentation

## 2015-10-09 DIAGNOSIS — F17210 Nicotine dependence, cigarettes, uncomplicated: Secondary | ICD-10-CM | POA: Insufficient documentation

## 2015-10-09 DIAGNOSIS — J069 Acute upper respiratory infection, unspecified: Secondary | ICD-10-CM | POA: Insufficient documentation

## 2015-10-09 DIAGNOSIS — F1490 Cocaine use, unspecified, uncomplicated: Secondary | ICD-10-CM | POA: Diagnosis not present

## 2015-10-09 DIAGNOSIS — Z792 Long term (current) use of antibiotics: Secondary | ICD-10-CM | POA: Insufficient documentation

## 2015-10-09 DIAGNOSIS — Z79899 Other long term (current) drug therapy: Secondary | ICD-10-CM | POA: Diagnosis not present

## 2015-10-09 DIAGNOSIS — F1290 Cannabis use, unspecified, uncomplicated: Secondary | ICD-10-CM | POA: Insufficient documentation

## 2015-10-09 DIAGNOSIS — B9789 Other viral agents as the cause of diseases classified elsewhere: Secondary | ICD-10-CM

## 2015-10-09 DIAGNOSIS — R05 Cough: Secondary | ICD-10-CM | POA: Diagnosis present

## 2015-10-09 HISTORY — DX: Essential (primary) hypertension: I10

## 2015-10-09 LAB — POCT RAPID STREP A: Streptococcus, Group A Screen (Direct): NEGATIVE

## 2015-10-09 MED ORDER — BENZONATATE 100 MG PO CAPS: ORAL_CAPSULE | ORAL | 0 refills | Status: DC

## 2015-10-09 MED ORDER — DEXAMETHASONE 4 MG PO TABS
10.0000 mg | ORAL_TABLET | Freq: Once | ORAL | Status: AC
  Administered 2015-10-09: 10 mg via ORAL
  Filled 2015-10-09 (×2): qty 2.5

## 2015-10-09 NOTE — Discharge Instructions (Signed)

## 2015-10-09 NOTE — ED Triage Notes (Signed)
Pt ambulatory to room from triage, reports URI sx x 2 days, including sore throat, bilat ear pain, cough w/ yellow sputum.  Pt NAD at this time, resp equal and unlabored, skin warm and dry.

## 2015-10-09 NOTE — ED Provider Notes (Signed)
[facility] Emergency Department Provider Note  ____________________________________________   First MD Initiated Contact with Patient 10/09/15 0533     (approximate)  I have reviewed the triage vital signs and the nursing notes.   HISTORY  Chief Complaint URI    HPI Jeanette Alexander is a 25 y.o. female who has a past medical history of hypertensionand obesity who presents for evaluation of gradual onset and worsening upper respiratory/viral symptoms such as nasal congestion, dry cough, runny nose, bilateral ear pain, and sore throat.  She states that the pain is now severe particularly in her throat.  She is not having any trouble swallowing that it is painful to do so.  She has had a tonsillectomy in the past.  She denies fever/chills, chest pain, shortness of breath, nausea, vomiting, diarrhea, abdominal pain, dysuria.   Past Medical History:  Diagnosis Date  . Acquired acanthosis nigricans   . Allergic rhinitis, cause unspecified   . Anemia, unspecified   . Bipolar disorder, unspecified (HCC)   . Cough variant asthma   . Dysthymic disorder   . Essential hypertension, benign   . Headache(784.0)   . Hypertension   . Major depressive disorder, recurrent episode, moderate (HCC)   . Migraine, unspecified, without mention of intractable migraine without mention of status migrainosus   . Obesity, unspecified   . Other nonspecific abnormal finding of lung field   . Panic disorder     Patient Active Problem List   Diagnosis Date Noted  . H/O: HTN (hypertension) 09/03/2014  . Depression, major, recurrent, moderate (HCC) 09/03/2014  . H/O: obesity 09/03/2014  . Episodic paroxysmal anxiety disorder 09/03/2014  . SOB (shortness of breath) 10/16/2013  . Heart palpitations 09/27/2013    Past Surgical History:  Procedure Laterality Date  . CESAREAN SECTION    . TONSILLECTOMY      Prior to Admission medications   Medication Sig Start Date End Date  Taking? Authorizing Provider  acetaminophen-codeine (TYLENOL #3) 300-30 MG per tablet Take 1-2 tablets by mouth every 4 (four) hours as needed for moderate pain. 09/24/14   Chinita Pester, FNP  amoxicillin (AMOXIL) 500 MG capsule Take 1 capsule (500 mg total) by mouth 3 (three) times daily. 11/12/14   Emily Filbert, MD  azithromycin (ZITHROMAX Z-PAK) 250 MG tablet Take 2 tablets (500 mg) on  Day 1,  followed by 1 tablet (250 mg) once daily on Days 2 through 5. 09/24/14   Cari B Triplett, FNP  benzonatate (TESSALON PERLES) 100 MG capsule Allow the capsule to dissolve on your tongue and the back of your throat to help with sore throat and cough.  Use 1 capsule as needed up to 4 times per day. 10/09/15   Loleta Rose, MD  busPIRone (BUSPAR) 10 MG tablet Take 10 mg by mouth 3 (three) times daily.    Historical Provider, MD  clonazePAM (KLONOPIN) 0.5 MG tablet Take 0.5 mg by mouth 2 (two) times daily as needed for anxiety.    Historical Provider, MD  fluconazole (DIFLUCAN) 150 MG tablet Take 1 tablet (150 mg) by mouth once for symptoms of a yeast infection.  If you continue to have symptoms in 3 days, take another tablet (150 mg) by mouth. 10/09/14   Loleta Rose, MD  FLUoxetine (PROZAC) 20 MG tablet Take 20 mg by mouth daily.    Historical Provider, MD  hydrochlorothiazide (HYDRODIURIL) 12.5 MG tablet TAKE 1 TABLET BY MOUTH IN THE MORNING FOR BLOOD PRESSURE 09/11/14  Alba CoryKrichna Sowles, MD  ibuprofen (ADVIL,MOTRIN) 800 MG tablet Take 1 tablet (800 mg total) by mouth every 8 (eight) hours as needed for moderate pain. 11/24/14   Irean HongJade J Sung, MD  metoprolol succinate (TOPROL-XL) 25 MG 24 hr tablet TAKE 1 TABLET BY MOUTH EVERY DAY FOR BLOOD PRESSURE AND PALPITATIONS 09/11/14   Alba CoryKrichna Sowles, MD  oxyCODONE-acetaminophen (ROXICET) 5-325 MG tablet Take 1 tablet by mouth every 6 (six) hours as needed. 03/16/15   Jeanmarie PlantJames A McShane, MD  predniSONE (DELTASONE) 50 MG tablet One tablet by mouth daily 11/12/14   Emily FilbertJonathan E  Williams, MD  traZODone (DESYREL) 100 MG tablet Take 50 mg by mouth at bedtime.    Historical Provider, MD    Allergies Depakote [divalproex sodium] and Tegretol [carbamazepine]  Family History  Problem Relation Age of Onset  . Hypertension Mother   . Leukemia Mother   . Diabetes Mother   . Heart murmur Mother   . Heart attack Mother   . Heart failure Mother     Social History Social History  Substance Use Topics  . Smoking status: Current Every Day Smoker    Types: Cigars  . Smokeless tobacco: Never Used  . Alcohol use Yes     Comment: occassionally    Review of Systems Constitutional: No fever/chills Eyes: No visual changes. ENT: +sore throat, nasal congestion, runny nose, bilateral ear pain Cardiovascular: Denies chest pain. Respiratory: Denies shortness of breath.  Frequent dry cough Gastrointestinal: No abdominal pain.  No nausea, no vomiting.  No diarrhea.  No constipation. Genitourinary: Negative for dysuria. Musculoskeletal: Negative for back pain. Skin: Negative for rash. Neurological: Negative for headaches, focal weakness or numbness.  10-point ROS otherwise negative.  ____________________________________________   PHYSICAL EXAM:  VITAL SIGNS: ED Triage Vitals  Enc Vitals Group     BP 10/09/15 0507 131/80     Pulse Rate 10/09/15 0507 73     Resp 10/09/15 0507 16     Temp 10/09/15 0507 98.2 F (36.8 C)     Temp Source 10/09/15 0507 Oral     SpO2 10/09/15 0507 98 %     Weight 10/09/15 0507 260 lb (117.9 kg)     Height 10/09/15 0507 5\' 5"  (1.651 m)     Head Circumference --      Peak Flow --      Pain Score 10/09/15 0508 10     Pain Loc --      Pain Edu? --      Excl. in GC? --     Constitutional: Alert and oriented. Appears uncomfortable but in no respiratory distress Eyes: Conjunctivae are normal. PERRL. EOMI. eyes are little bit watery Head: Atraumatic. Ears:  Healthy appearing ear canals and TMs bilaterally.  Some cerumen. Nose:  +congestion/rhinnorhea. Mouth/Throat: Mucous membranes are moist.  Oropharynx mildly erythematous. No purulence, petechiae, nor exudate Neck: No stridor.  No meningeal signs.  No cervical lymphadenopathy nor submandibular induration Cardiovascular: Normal rate, regular rhythm. Good peripheral circulation. Grossly normal heart sounds.   Respiratory: Normal respiratory effort.  No retractions. Lungs CTAB. Gastrointestinal: Soft and nontender. No distention.  Musculoskeletal: No lower extremity tenderness nor edema. No gross deformities of extremities. Neurologic:  Normal speech and language. No gross focal neurologic deficits are appreciated.  Skin:  Skin is warm, dry and intact. No rash noted. Psychiatric: Mood and affect are normal. Speech and behavior are normal.  ____________________________________________   LABS (all labs ordered are listed, but only abnormal results are displayed)  Labs  Reviewed  POCT RAPID STREP A   ____________________________________________  EKG  None ____________________________________________  RADIOLOGY   No results found.  ____________________________________________   PROCEDURES  Procedure(s) performed:   Procedures   Critical Care performed: No ____________________________________________   INITIAL IMPRESSION / ASSESSMENT AND PLAN / ED COURSE  Pertinent labs & imaging results that were available during my care of the patient were reviewed by me and considered in my medical decision making (see chart for details).  The patient's signs, symptoms, and history appear most consistent with a viral URI.  Her exam is overall reassuring and benign.  I am giving her a dose of Decadron 10 mg by mouth in the emergency department to help with her sore throat and it likely also help her bilateral ear pain.  There is no indication for antibiotics at this time.  I encouraged her to try Sudafed as needed but sparingly.  Also prescribed Tessalon to help  with the cough and to help numb her throat.   ____________________________________________  FINAL CLINICAL IMPRESSION(S) / ED DIAGNOSES  Final diagnoses:  Viral URI with cough     MEDICATIONS GIVEN DURING THIS VISIT:  Medications  dexamethasone (DECADRON) tablet 10 mg (not administered)     NEW OUTPATIENT MEDICATIONS STARTED DURING THIS VISIT:  New Prescriptions   BENZONATATE (TESSALON PERLES) 100 MG CAPSULE    Allow the capsule to dissolve on your tongue and the back of your throat to help with sore throat and cough.  Use 1 capsule as needed up to 4 times per day.      Note:  This document was prepared using Dragon voice recognition software and may include unintentional dictation errors.    Loleta Rose, MD 10/09/15 [phone]

## 2015-10-09 NOTE — ED Notes (Signed)
Discharge instructions reviewed with patient. Patient verbalized understanding. Patient ambulated to lobby without difficulty.   

## 2015-10-26 ENCOUNTER — Encounter: Payer: Self-pay | Admitting: Emergency Medicine

## 2015-10-26 ENCOUNTER — Emergency Department
Admission: EM | Admit: 2015-10-26 | Discharge: 2015-10-26 | Disposition: A | Discharge status: Home / Self Care | Payer: Medicaid Other | Attending: Emergency Medicine | Admitting: Emergency Medicine

## 2015-10-26 DIAGNOSIS — F17210 Nicotine dependence, cigarettes, uncomplicated: Secondary | ICD-10-CM | POA: Insufficient documentation

## 2015-10-26 DIAGNOSIS — Z791 Long term (current) use of non-steroidal anti-inflammatories (NSAID): Secondary | ICD-10-CM | POA: Diagnosis not present

## 2015-10-26 DIAGNOSIS — F1290 Cannabis use, unspecified, uncomplicated: Secondary | ICD-10-CM | POA: Insufficient documentation

## 2015-10-26 DIAGNOSIS — R112 Nausea with vomiting, unspecified: Secondary | ICD-10-CM

## 2015-10-26 DIAGNOSIS — F1490 Cocaine use, unspecified, uncomplicated: Secondary | ICD-10-CM | POA: Insufficient documentation

## 2015-10-26 DIAGNOSIS — I10 Essential (primary) hypertension: Secondary | ICD-10-CM | POA: Insufficient documentation

## 2015-10-26 DIAGNOSIS — J029 Acute pharyngitis, unspecified: Secondary | ICD-10-CM | POA: Diagnosis not present

## 2015-10-26 DIAGNOSIS — Z79899 Other long term (current) drug therapy: Secondary | ICD-10-CM | POA: Insufficient documentation

## 2015-10-26 DIAGNOSIS — Z792 Long term (current) use of antibiotics: Secondary | ICD-10-CM | POA: Diagnosis not present

## 2015-10-26 LAB — POCT RAPID STREP A: STREPTOCOCCUS, GROUP A SCREEN (DIRECT): NEGATIVE

## 2015-10-26 MED ORDER — DEXAMETHASONE 4 MG PO TABS
10.0000 mg | ORAL_TABLET | Freq: Once | ORAL | Status: DC
  Filled 2015-10-26: qty 2.5

## 2015-10-26 MED ORDER — ACETAMINOPHEN 325 MG PO TABS: 650.0000 mg | ORAL_TABLET | Freq: Once | ORAL | Status: DC | PRN

## 2015-10-26 MED ORDER — ONDANSETRON 4 MG PO TBDP: ORAL_TABLET | ORAL | 0 refills | Status: DC

## 2015-10-26 NOTE — ED Notes (Signed)
Pt not in room, registration states pt "walked out because she didn't want to wait anymore." will attempt to call pt at phone number listed to review prescription and discharge instructions.

## 2015-10-26 NOTE — ED Provider Notes (Signed)
[facility] Emergency Department Provider Note  ____________________________________________   First MD Initiated Contact with Patient 10/26/15 [phone]     (approximate)  I have reviewed the triage vital signs and the nursing notes.   HISTORY  Chief Complaint Sore Throat    HPI Jeanette Alexander is a 25 y.o. female who presents for evaluation of sore throat.  She has also had several episodes of vomiting recently.  She reports that she frequently gets laryngitis or pharyngitis and that the pain is severe and eating and drinking makes it worse.  She denies fever/chills, chest pain, shortness of breath, abdominal pain.  She also has chronic symptoms of nasal congestion, dry cough, runny nose.  This time she does not have any ear pain.  She states that nothing makes her sore throat better even though she has seen a primary care doctor and has been taking allergy medicine.  She has had a prior tonsillectomy.  Her voice is soft and hoarse but she is not having any difficulty swallowing.   Past Medical History:  Diagnosis Date  . Acquired acanthosis nigricans   . Allergic rhinitis, cause unspecified   . Anemia, unspecified   . Bipolar disorder, unspecified (HCC)   . Cough variant asthma   . Dysthymic disorder   . Essential hypertension, benign   . Headache(784.0)   . Hypertension   . Major depressive disorder, recurrent episode, moderate (HCC)   . Migraine, unspecified, without mention of intractable migraine without mention of status migrainosus   . Obesity, unspecified   . Other nonspecific abnormal finding of lung field   . Panic disorder     Patient Active Problem List   Diagnosis Date Noted  . H/O: HTN (hypertension) 09/03/2014  . Depression, major, recurrent, moderate (HCC) 09/03/2014  . H/O: obesity 09/03/2014  . Episodic paroxysmal anxiety disorder 09/03/2014  . SOB (shortness of breath) 10/16/2013  . Heart palpitations 09/27/2013    Past  Surgical History:  Procedure Laterality Date  . CESAREAN SECTION    . TONSILLECTOMY      Prior to Admission medications   Medication Sig Start Date End Date Taking? Authorizing Provider  acetaminophen-codeine (TYLENOL #3) 300-30 MG per tablet Take 1-2 tablets by mouth every 4 (four) hours as needed for moderate pain. 09/24/14   Chinita Pesterari B Triplett, FNP  amoxicillin (AMOXIL) 500 MG capsule Take 1 capsule (500 mg total) by mouth 3 (three) times daily. 11/12/14   Emily FilbertJonathan E Williams, MD  azithromycin (ZITHROMAX Z-PAK) 250 MG tablet Take 2 tablets (500 mg) on  Day 1,  followed by 1 tablet (250 mg) once daily on Days 2 through 5. 09/24/14   Cari B Triplett, FNP  benzonatate (TESSALON PERLES) 100 MG capsule Allow the capsule to dissolve on your tongue and the back of your throat to help with sore throat and cough.  Use 1 capsule as needed up to 4 times per day. 10/09/15   Loleta Roseory Rahima Fleishman, MD  busPIRone (BUSPAR) 10 MG tablet Take 10 mg by mouth 3 (three) times daily.    Historical Provider, MD  clonazePAM (KLONOPIN) 0.5 MG tablet Take 0.5 mg by mouth 2 (two) times daily as needed for anxiety.    Historical Provider, MD  fluconazole (DIFLUCAN) 150 MG tablet Take 1 tablet (150 mg) by mouth once for symptoms of a yeast infection.  If you continue to have symptoms in 3 days, take another tablet (150 mg) by mouth. 10/09/14   Loleta Roseory Cola Highfill, MD  FLUoxetine (  PROZAC) 20 MG tablet Take 20 mg by mouth daily.    Historical Provider, MD  hydrochlorothiazide (HYDRODIURIL) 12.5 MG tablet TAKE 1 TABLET BY MOUTH IN THE MORNING FOR BLOOD PRESSURE 09/11/14   Alba Treesa Mccully, MD  ibuprofen (ADVIL,MOTRIN) 800 MG tablet Take 1 tablet (800 mg total) by mouth every 8 (eight) hours as needed for moderate pain. 11/24/14   Irean Hong, MD  metoprolol succinate (TOPROL-XL) 25 MG 24 hr tablet TAKE 1 TABLET BY MOUTH EVERY DAY FOR BLOOD PRESSURE AND PALPITATIONS 09/11/14   Alba Chavis Tessler, MD  ondansetron (ZOFRAN ODT) 4 MG disintegrating tablet Allow  1-2 tablets to dissolve in your mouth every 8 hours as needed for nausea/vomiting 10/26/15   Loleta Rose, MD  oxyCODONE-acetaminophen (ROXICET) 5-325 MG tablet Take 1 tablet by mouth every 6 (six) hours as needed. 03/16/15   Jeanmarie Plant, MD  predniSONE (DELTASONE) 50 MG tablet One tablet by mouth daily 11/12/14   Emily Filbert, MD  traZODone (DESYREL) 100 MG tablet Take 50 mg by mouth at bedtime.    Historical Provider, MD    Allergies Depakote [divalproex sodium]; Tegretol [carbamazepine]; and Cepacol [cetylpyridinium]  Family History  Problem Relation Age of Onset  . Hypertension Mother   . Leukemia Mother   . Diabetes Mother   . Heart murmur Mother   . Heart attack Mother   . Heart failure Mother     Social History Social History  Substance Use Topics  . Smoking status: Current Every Day Smoker    Types: Cigars  . Smokeless tobacco: Never Used  . Alcohol use Yes     Comment: occassionally    Review of Systems Constitutional: No fever/chills Eyes: No visual changes. ENT: +sore throat. Cardiovascular: Denies chest pain. Respiratory: Denies shortness of breath. Gastrointestinal: No abdominal pain.  +N/V.  No diarrhea.  No constipation. Genitourinary: Negative for dysuria. Musculoskeletal: Negative for back pain. Skin: Negative for rash. Neurological: Negative for headaches, focal weakness or numbness.  10-point ROS otherwise negative.  ____________________________________________   PHYSICAL EXAM:  VITAL SIGNS: ED Triage Vitals  Enc Vitals Group     BP 10/26/15 0104 (!) 119/55     Pulse Rate 10/26/15 0104 70     Resp 10/26/15 0104 18     Temp 10/26/15 0104 97.8 F (36.6 C)     Temp Source 10/26/15 0104 Oral     SpO2 10/26/15 0104 97 %     Weight 10/26/15 0105 260 lb (117.9 kg)     Height 10/26/15 0105 5\' 5"  (1.651 m)     Head Circumference --      Peak Flow --      Pain Score 10/26/15 0105 10     Pain Loc --      Pain Edu? --      Excl. in GC?  --     Constitutional: Alert and oriented. Well appearing and in no acute distress. Eyes: Conjunctivae are normal. PERRL. EOMI. Head: Atraumatic. Nose: No congestion/rhinnorhea. Mouth/Throat: Mucous membranes are moist.  Oropharynx non-erythematous.  Tonsils surgically absent. Neck: No stridor.  No meningeal signs.   Cardiovascular: Normal rate, regular rhythm. Good peripheral circulation. Grossly normal heart sounds. Respiratory: Normal respiratory effort.  No retractions. Lungs CTAB. Gastrointestinal: Soft and nontender. No distention.  Musculoskeletal: No lower extremity tenderness nor edema. No gross deformities of extremities. Neurologic:  Normal speech and language. No gross focal neurologic deficits are appreciated.  Skin:  Skin is warm, dry and intact. No rash noted.  Psychiatric: Mood and affect are normal. Speech and behavior are normal.  ____________________________________________   LABS (all labs ordered are listed, but only abnormal results are displayed)  Labs Reviewed  POCT RAPID STREP A   ____________________________________________  EKG  None - EKG not ordered by ED physician ____________________________________________  RADIOLOGY   No results found.  ____________________________________________   PROCEDURES  Procedure(s) performed:   Procedures   Critical Care performed: No ____________________________________________   INITIAL IMPRESSION / ASSESSMENT AND PLAN / ED COURSE  Pertinent labs & imaging results that were available during my care of the patient were reviewed by me and considered in my medical decision making (see chart for details).  I discussed with patient that her symptoms seem chronic at this point given that she has had multiple issues that are similar.  She has no physical exam findings that are concerning.  For her comfort I will give her a dose of Decadron 10 mg by mouth and I encouraged her to continue taking the Tessalon  Perles prescribed last time to soothe her throat.  I am prescribing Zofran ODT as needed for some nausea and vomiting but I encouraged her to follow up as an outpatient either with her PCP or with an ENT specialist.  I gave my usual and customary return precautions.      Clinical Course    ____________________________________________  FINAL CLINICAL IMPRESSION(S) / ED DIAGNOSES  Final diagnoses:  Non-intractable vomiting with nausea, vomiting of unspecified type  Sore throat     MEDICATIONS GIVEN DURING THIS VISIT:  Medications  dexamethasone (DECADRON) tablet 10 mg (not administered)     NEW OUTPATIENT MEDICATIONS STARTED DURING THIS VISIT:  New Prescriptions   ONDANSETRON (ZOFRAN ODT) 4 MG DISINTEGRATING TABLET    Allow 1-2 tablets to dissolve in your mouth every 8 hours as needed for nausea/vomiting      Note:  This document was prepared using Dragon voice recognition software and may include unintentional dictation errors.    Loleta Rose, MD 10/26/15 [phone]

## 2015-10-26 NOTE — ED Notes (Signed)
Pt reports multiple tylenol today, last since 0030.  Tylenol order DC's.

## 2015-10-26 NOTE — ED Triage Notes (Signed)
Pt via POV with c/o throat pain since yesterday with cough and hoarse voice.  Pt reports N/V x2 during that time.  Pt reports hx of laryngitis.

## 2015-10-26 NOTE — ED Notes (Signed)
Called pt phone number listed on demographic sheet with no answer or voicemail available.

## 2015-10-26 NOTE — Discharge Instructions (Signed)
As we discussed, there is no sign of bacterial infection in your throat.  You have had the symptoms multiple times in the past and we encourage you to continue taking your allergy medicine, over-the-counter pain medication such as ibuprofen and Tylenol, and he should consider seeing an ENT specialist such as Dr. Ernestine McmurrayIngle or one of his colleagues for further evaluation.  Continue to use the [employer]on Perles that I prescribed for the last time to soothe the back your throat.

## 2015-11-04 ENCOUNTER — Emergency Department
Admission: EM | Admit: 2015-11-04 | Discharge: 2015-11-04 | Disposition: A | Discharge status: Home / Self Care | Payer: Medicaid Other | Attending: Student in an Organized Health Care Education/Training Program | Admitting: Student in an Organized Health Care Education/Training Program

## 2015-11-04 ENCOUNTER — Emergency Department: Payer: Medicaid Other

## 2015-11-04 ENCOUNTER — Encounter: Payer: Self-pay | Admitting: Emergency Medicine

## 2015-11-04 DIAGNOSIS — R112 Nausea with vomiting, unspecified: Secondary | ICD-10-CM

## 2015-11-04 DIAGNOSIS — Z791 Long term (current) use of non-steroidal anti-inflammatories (NSAID): Secondary | ICD-10-CM | POA: Insufficient documentation

## 2015-11-04 DIAGNOSIS — F17210 Nicotine dependence, cigarettes, uncomplicated: Secondary | ICD-10-CM | POA: Diagnosis not present

## 2015-11-04 DIAGNOSIS — R002 Palpitations: Secondary | ICD-10-CM | POA: Diagnosis not present

## 2015-11-04 DIAGNOSIS — Z7952 Long term (current) use of systemic steroids: Secondary | ICD-10-CM | POA: Insufficient documentation

## 2015-11-04 DIAGNOSIS — Z792 Long term (current) use of antibiotics: Secondary | ICD-10-CM | POA: Diagnosis not present

## 2015-11-04 DIAGNOSIS — R079 Chest pain, unspecified: Secondary | ICD-10-CM | POA: Diagnosis not present

## 2015-11-04 DIAGNOSIS — I10 Essential (primary) hypertension: Secondary | ICD-10-CM | POA: Diagnosis not present

## 2015-11-04 DIAGNOSIS — Z79899 Other long term (current) drug therapy: Secondary | ICD-10-CM | POA: Insufficient documentation

## 2015-11-04 DIAGNOSIS — R0602 Shortness of breath: Secondary | ICD-10-CM | POA: Diagnosis not present

## 2015-11-04 LAB — CBC
HEMATOCRIT: 37 % (ref 35.0–47.0)
HEMOGLOBIN: 12.2 g/dL (ref 12.0–16.0)
MCH: 24.7 pg — ABNORMAL LOW (ref 26.0–34.0)
MCHC: 33 g/dL (ref 32.0–36.0)
MCV: 74.8 fL — AB (ref 80.0–100.0)
Platelets: 349 10*3/uL (ref 150–440)
RBC: 4.94 MIL/uL (ref 3.80–5.20)
RDW: 16.3 % — AB (ref 11.5–14.5)
WBC: 5.9 10*3/uL (ref 3.6–11.0)

## 2015-11-04 LAB — BASIC METABOLIC PANEL
ANION GAP: 7 (ref 5–15)
BUN: 9 mg/dL (ref 6–20)
CHLORIDE: 106 mmol/L (ref 101–111)
CO2: 24 mmol/L (ref 22–32)
Calcium: 8.9 mg/dL (ref 8.9–10.3)
Creatinine, Ser: 0.61 mg/dL (ref 0.44–1.00)
GFR calc Af Amer: >60 mL/min (ref >60)
Glucose, Bld: 88 mg/dL (ref 65–99)
POTASSIUM: 3.9 mmol/L (ref 3.5–5.1)
SODIUM: 137 mmol/L (ref 135–145)

## 2015-11-04 LAB — TROPONIN I: Troponin I: <0.03 ng/mL (ref <0.03)

## 2015-11-04 MED ORDER — PREDNISONE 20 MG PO TABS: 40.0000 mg | ORAL_TABLET | Freq: Every day | ORAL | 0 refills | Status: DC

## 2015-11-04 MED ORDER — ALBUTEROL SULFATE HFA 108 (90 BASE) MCG/ACT IN AERS: 2.0000 | INHALATION_SPRAY | Freq: Four times a day (QID) | RESPIRATORY_TRACT | 2 refills | Status: DC | PRN

## 2015-11-04 MED ORDER — PROMETHAZINE HCL 12.5 MG PO TABS: 12.5000 mg | ORAL_TABLET | Freq: Four times a day (QID) | ORAL | 0 refills | Status: DC | PRN

## 2015-11-04 NOTE — ED Notes (Signed)
Pt in x-ray at this time

## 2015-11-04 NOTE — ED Triage Notes (Signed)
Pt reports centralized chest pain x1 week. Pt also reports occasionally tingling in bilateral hands.

## 2015-11-04 NOTE — ED Provider Notes (Signed)
[facility] Emergency Department Provider Note    None    (approximate)  I have reviewed the triage vital signs and the nursing notes.   HISTORY  Chief Complaint Chest Pain    HPI Jeanette Alexander is a 25 y.o. female presents with 4 days of intermittent palpitations chest pain, shortness of breath and 1 day of nausea vomiting. Patient states that she woke up this morning feeling sick and had several episodes of nonbloody nonbilious emesis. She denies any abdominal pain. She works [employer]. States that her wife of just recently got over a case than or virus. She denies any orthopnea. No fevers. Denies any diarrhea. No recent antibiotic use.  She is an occasional smoker.  Past Medical History:  Diagnosis Date  . Acquired acanthosis nigricans   . Allergic rhinitis, cause unspecified   . Anemia, unspecified   . Bipolar disorder, unspecified (HCC)   . Cough variant asthma   . Dysthymic disorder   . Essential hypertension, benign   . Headache(784.0)   . Hypertension   . Major depressive disorder, recurrent episode, moderate (HCC)   . Migraine, unspecified, without mention of intractable migraine without mention of status migrainosus   . Obesity, unspecified   . Other nonspecific abnormal finding of lung field   . Panic disorder     Patient Active Problem List   Diagnosis Date Noted  . H/O: HTN (hypertension) 09/03/2014  . Depression, major, recurrent, moderate (HCC) 09/03/2014  . H/O: obesity 09/03/2014  . Episodic paroxysmal anxiety disorder 09/03/2014  . SOB (shortness of breath) 10/16/2013  . Heart palpitations 09/27/2013    Past Surgical History:  Procedure Laterality Date  . CESAREAN SECTION    . TONSILLECTOMY      Prior to Admission medications   Medication Sig Start Date End Date Taking? Authorizing Provider  acetaminophen-codeine (TYLENOL #3) 300-30 MG per tablet Take 1-2 tablets by mouth every 4 (four) hours as needed for moderate pain.  09/24/14   Chinita Pesterari B Triplett, FNP  albuterol (PROVENTIL HFA;VENTOLIN HFA) 108 (90 Base) MCG/ACT inhaler Inhale 2 puffs into the lungs every 6 (six) hours as needed for wheezing or shortness of breath. 11/04/15   Willy EddyPatrick Aspen Lawrance, MD  amoxicillin (AMOXIL) 500 MG capsule Take 1 capsule (500 mg total) by mouth 3 (three) times daily. 11/12/14   Emily FilbertJonathan E Williams, MD  azithromycin (ZITHROMAX Z-PAK) 250 MG tablet Take 2 tablets (500 mg) on  Day 1,  followed by 1 tablet (250 mg) once daily on Days 2 through 5. 09/24/14   Cari B Triplett, FNP  benzonatate (TESSALON PERLES) 100 MG capsule Allow the capsule to dissolve on your tongue and the back of your throat to help with sore throat and cough.  Use 1 capsule as needed up to 4 times per day. 10/09/15   Loleta Roseory Forbach, MD  busPIRone (BUSPAR) 10 MG tablet Take 10 mg by mouth 3 (three) times daily.    Historical Provider, MD  clonazePAM (KLONOPIN) 0.5 MG tablet Take 0.5 mg by mouth 2 (two) times daily as needed for anxiety.    Historical Provider, MD  fluconazole (DIFLUCAN) 150 MG tablet Take 1 tablet (150 mg) by mouth once for symptoms of a yeast infection.  If you continue to have symptoms in 3 days, take another tablet (150 mg) by mouth. 10/09/14   Loleta Roseory Forbach, MD  FLUoxetine (PROZAC) 20 MG tablet Take 20 mg by mouth daily.    Historical Provider, MD  hydrochlorothiazide (HYDRODIURIL) 12.5  MG tablet TAKE 1 TABLET BY MOUTH IN THE MORNING FOR BLOOD PRESSURE 09/11/14   Alba Cory, MD  ibuprofen (ADVIL,MOTRIN) 800 MG tablet Take 1 tablet (800 mg total) by mouth every 8 (eight) hours as needed for moderate pain. 11/24/14   Irean Hong, MD  metoprolol succinate (TOPROL-XL) 25 MG 24 hr tablet TAKE 1 TABLET BY MOUTH EVERY DAY FOR BLOOD PRESSURE AND PALPITATIONS 09/11/14   Alba Cory, MD  ondansetron (ZOFRAN ODT) 4 MG disintegrating tablet Allow 1-2 tablets to dissolve in your mouth every 8 hours as needed for nausea/vomiting 10/26/15   Loleta Rose, MD    oxyCODONE-acetaminophen (ROXICET) 5-325 MG tablet Take 1 tablet by mouth every 6 (six) hours as needed. 03/16/15   Jeanmarie Plant, MD  predniSONE (DELTASONE) 20 MG tablet Take 2 tablets (40 mg total) by mouth daily. 11/04/15 11/09/15  Willy Eddy, MD  predniSONE (DELTASONE) 50 MG tablet One tablet by mouth daily 11/12/14   Emily Filbert, MD  promethazine (PHENERGAN) 12.5 MG tablet Take 1 tablet (12.5 mg total) by mouth every 6 (six) hours as needed for nausea or vomiting. 11/04/15   Willy Eddy, MD  traZODone (DESYREL) 100 MG tablet Take 50 mg by mouth at bedtime.    Historical Provider, MD    Allergies Depakote [divalproex sodium]; Tegretol [carbamazepine]; and Cepacol [cetylpyridinium]  Family History  Problem Relation Age of Onset  . Hypertension Mother   . Leukemia Mother   . Diabetes Mother   . Heart murmur Mother   . Heart attack Mother   . Heart failure Mother     Social History Social History  Substance Use Topics  . Smoking status: Current Every Day Smoker    Types: Cigars  . Smokeless tobacco: Never Used  . Alcohol use Yes     Comment: occassionally    Review of Systems Patient denies headaches, rhinorrhea, blurry vision, numbness, shortness of breath, chest pain, edema, cough, abdominal pain, nausea, vomiting, diarrhea, dysuria, fevers, rashes or hallucinations unless otherwise stated above in HPI. ____________________________________________   PHYSICAL EXAM:  VITAL SIGNS: Vitals:   11/04/15 1022  BP: (!) 150/94  Pulse: 70  Resp: 16  Temp: 98.2 F (36.8 C)    Constitutional: Alert and oriented. Well appearing and in no acute distress. Eyes: Conjunctivae are normal. PERRL. EOMI. Head: Atraumatic. Nose: No congestion/rhinnorhea. Mouth/Throat: Mucous membranes are moist.  Oropharynx non-erythematous. Neck: No stridor. Painless ROM. No cervical spine tenderness to palpation Hematological/Lymphatic/Immunilogical: No cervical  lymphadenopathy. Cardiovascular: Normal rate, regular rhythm. Grossly normal heart sounds.  Good peripheral circulation. Respiratory: Normal respiratory effort.  No retractions. Lungs With coarse bibasilar breath sounds. Gastrointestinal: Soft and nontender. No distention. No abdominal bruits. No CVA tenderness. Genitourinary:  Musculoskeletal: No lower extremity tenderness nor edema.  No joint effusions. Neurologic:  Normal speech and language. No gross focal neurologic deficits are appreciated. No gait instability. Skin:  Skin is warm, dry and intact. No rash noted. Psychiatric: Mood and affect are normal. Speech and behavior are normal.  ____________________________________________   LABS (all labs ordered are listed, but only abnormal results are displayed)  Results for orders placed or performed during the hospital encounter of 11/04/15 (from the past 24 hour(s))  Basic metabolic panel     Status: None   Collection Time: 11/04/15 10:24 AM  Result Value Ref Range   Sodium 137 135 - 145 mmol/L   Potassium 3.9 3.5 - 5.1 mmol/L   Chloride 106 101 - 111 mmol/L   CO2  24 22 - 32 mmol/L   Glucose, Bld 88 65 - 99 mg/dL   BUN 9 6 - 20 mg/dL   Creatinine, Ser 9.60 0.44 - 1.00 mg/dL   Calcium 8.9 8.9 - 45.4 mg/dL   GFR calc non Af Amer >60 >60 mL/min   GFR calc Af Amer >60 >60 mL/min   Anion gap 7 5 - 15  CBC     Status: Abnormal   Collection Time: 11/04/15 10:24 AM  Result Value Ref Range   WBC 5.9 3.6 - 11.0 K/uL   RBC 4.94 3.80 - 5.20 MIL/uL   Hemoglobin 12.2 12.0 - 16.0 g/dL   HCT 09.8 11.9 - 14.7 %   MCV 74.8 (L) 80.0 - 100.0 fL   MCH 24.7 (L) 26.0 - 34.0 pg   MCHC 33.0 32.0 - 36.0 g/dL   RDW 82.9 (H) 56.2 - 13.0 %   Platelets 349 150 - 440 K/uL  Troponin I     Status: None   Collection Time: 11/04/15 10:24 AM  Result Value Ref Range   Troponin I <0.03 <0.03 ng/mL   ____________________________________________  EKG My review and personal interpretation at Time: 10:24  AM indication chest pain, rate 75, sinus rhythm, normal intervals, normal axis, no acute ST elevations or depressions. ____________________________________________  RADIOLOGY  CXR my read shows no evidence of acute cardiopulmonary process.  ____________________________________________   PROCEDURES  Procedure(s) performed: none    Critical Care performed: no ____________________________________________   INITIAL IMPRESSION / ASSESSMENT AND PLAN / ED COURSE  Pertinent labs & imaging results that were available during my care of the patient were reviewed by me and considered in my medical decision making (see chart for details).  DDX: Pneumonia, bronchitis, gastroenteritis, ACS, dysrhythmia  Jeanette Alexander is a 25 y.o. who presents to the ED with several days of palpitations, shortness of breath or chest discomfort and 1 day of nausea vomiting. Her abdominal exam is soft and benign. Patient is afebrile and hemodynamically stable. Do not feel is clinically consistent with ACS based on history and normal EKG and troponin. Chest x-ray ordered to evaluate for pneumonia shows no infiltrate. No evidence of congestive heart failure. Patient is low risk Wells and perc negative.  Laboratory evaluation is unremarkable. Patient with lung sounds and history consistent with bronchitis. Will treat with albuterol inhalers as well as steroids. We'll provide symptomatically treatment for nausea. Patient was able to tolerate oral hydration was able to ambulate with a steady gait.  Have discussed with the patient and available family all diagnostics and treatments performed thus far and all questions were answered to the best of my ability. The patient demonstrates understanding and agreement with plan.   Clinical Course     ____________________________________________   FINAL CLINICAL IMPRESSION(S) / ED DIAGNOSES  Final diagnoses:  Palpitations  Non-intractable vomiting with nausea, vomiting of  unspecified type      NEW MEDICATIONS STARTED DURING THIS VISIT:  New Prescriptions   ALBUTEROL (PROVENTIL HFA;VENTOLIN HFA) 108 (90 BASE) MCG/ACT INHALER    Inhale 2 puffs into the lungs every 6 (six) hours as needed for wheezing or shortness of breath.   PREDNISONE (DELTASONE) 20 MG TABLET    Take 2 tablets (40 mg total) by mouth daily.   PROMETHAZINE (PHENERGAN) 12.5 MG TABLET    Take 1 tablet (12.5 mg total) by mouth every 6 (six) hours as needed for nausea or vomiting.     Note:  This document was prepared using Dragon voice  recognition software and may include unintentional dictation errors.    Willy Eddy, MD 11/04/15 [phone]

## 2017-06-15 ENCOUNTER — Other Ambulatory Visit: Payer: Self-pay

## 2017-06-15 ENCOUNTER — Encounter: Payer: Self-pay | Admitting: Emergency Medicine

## 2017-06-15 DIAGNOSIS — Z79899 Other long term (current) drug therapy: Secondary | ICD-10-CM | POA: Insufficient documentation

## 2017-06-15 DIAGNOSIS — F17290 Nicotine dependence, other tobacco product, uncomplicated: Secondary | ICD-10-CM | POA: Insufficient documentation

## 2017-06-15 DIAGNOSIS — R21 Rash and other nonspecific skin eruption: Secondary | ICD-10-CM | POA: Insufficient documentation

## 2017-06-15 DIAGNOSIS — I10 Essential (primary) hypertension: Secondary | ICD-10-CM | POA: Insufficient documentation

## 2017-06-15 NOTE — ED Triage Notes (Signed)
Pt presents to ED with rash on her neck. Pt states she first noticed her symptoms a few days ago but has a hx of the same. Last time was 4 months ago. +itchy

## 2017-06-16 ENCOUNTER — Emergency Department
Admission: EM | Admit: 2017-06-16 | Discharge: 2017-06-16 | Disposition: A | Discharge status: Home / Self Care | Payer: Medicaid Other | Attending: Emergency Medicine | Admitting: Emergency Medicine

## 2017-06-16 DIAGNOSIS — R21 Rash and other nonspecific skin eruption: Secondary | ICD-10-CM

## 2017-06-16 MED ORDER — DIPHENHYDRAMINE HCL 25 MG PO CAPS
25.0000 mg | ORAL_CAPSULE | Freq: Once | ORAL | Status: AC
  Administered 2017-06-16: 25 mg via ORAL
  Filled 2017-06-16: qty 1

## 2017-06-16 MED ORDER — DEXAMETHASONE SODIUM PHOSPHATE 10 MG/ML IJ SOLN
INTRAMUSCULAR | Status: AC
  Filled 2017-06-16: qty 1

## 2017-06-16 MED ORDER — DEXAMETHASONE 10 MG/ML FOR PEDIATRIC ORAL USE
10.0000 mg | Freq: Once | INTRAMUSCULAR | Status: AC
  Administered 2017-06-16: 10 mg via ORAL

## 2017-06-16 NOTE — ED Provider Notes (Signed)
[facility] Emergency Department Provider Note  ____________________________________________   First MD Initiated Contact with Patient 06/16/17 0036     (approximate)  I have reviewed the triage vital signs and the nursing notes.   HISTORY  Chief Complaint Rash    HPI Jeanette Alexander is a 27 y.o. female with medical history as listed below which does include an atopic history of allergies, asthma, and eczema, who presents for evaluation of a rash on her neck that started about 2 days ago.  It is gradually gotten worse.  She has been using Eucerin lotion but it does not seem to help.  She has not been taking any medication including no Benadryl.  She takes no medication chronically for her eczema.  She denies pain but reports that the rash does itch.  Nothing in particular makes it better or worse.  Some family members think it might be her eczema.  She has not been to her primary care doctor or started on any new medications recently.  She has no lesions in her mouth or anywhere else on her body.  She denies fever/chills, chest pain, shortness of breath, nausea, vomiting, and abdominal pain.    Past Medical History:  Diagnosis Date  . Acquired acanthosis nigricans   . Allergic rhinitis, cause unspecified   . Anemia, unspecified   . Bipolar disorder, unspecified (HCC)   . Cough variant asthma   . Dysthymic disorder   . Essential hypertension, benign   . Headache(784.0)   . Hypertension   . Major depressive disorder, recurrent episode, moderate (HCC)   . Migraine, unspecified, without mention of intractable migraine without mention of status migrainosus   . Obesity, unspecified   . Other nonspecific abnormal finding of lung field   . Panic disorder     Patient Active Problem List   Diagnosis Date Noted  . H/O: HTN (hypertension) 09/03/2014  . Depression, major, recurrent, moderate (HCC) 09/03/2014  . H/O: obesity 09/03/2014  . Episodic paroxysmal  anxiety disorder 09/03/2014  . SOB (shortness of breath) 10/16/2013  . Heart palpitations 09/27/2013    Past Surgical History:  Procedure Laterality Date  . CESAREAN SECTION    . TONSILLECTOMY      Prior to Admission medications   Medication Sig Start Date End Date Taking? Authorizing Provider  acetaminophen-codeine (TYLENOL #3) 300-30 MG per tablet Take 1-2 tablets by mouth every 4 (four) hours as needed for moderate pain. 09/24/14   Triplett, Rulon Eisenmengerari B, FNP  albuterol (PROVENTIL HFA;VENTOLIN HFA) 108 (90 Base) MCG/ACT inhaler Inhale 2 puffs into the lungs every 6 (six) hours as needed for wheezing or shortness of breath. 11/04/15   Willy Eddyobinson, Patrick, MD  amoxicillin (AMOXIL) 500 MG capsule Take 1 capsule (500 mg total) by mouth 3 (three) times daily. 11/12/14   Emily FilbertWilliams, Jonathan E, MD  azithromycin (ZITHROMAX Z-PAK) 250 MG tablet Take 2 tablets (500 mg) on  Day 1,  followed by 1 tablet (250 mg) once daily on Days 2 through 5. 09/24/14   Triplett, Cari B, FNP  benzonatate (TESSALON PERLES) 100 MG capsule Allow the capsule to dissolve on your tongue and the back of your throat to help with sore throat and cough.  Use 1 capsule as needed up to 4 times per day. 10/09/15   Loleta RoseForbach, Darrell Hauk, MD  busPIRone (BUSPAR) 10 MG tablet Take 10 mg by mouth 3 (three) times daily.    [provider]  clonazePAM (KLONOPIN) 0.5 MG tablet Take 0.5 mg  by mouth 2 (two) times daily as needed for anxiety.    [provider]  fluconazole (DIFLUCAN) 150 MG tablet Take 1 tablet (150 mg) by mouth once for symptoms of a yeast infection.  If you continue to have symptoms in 3 days, take another tablet (150 mg) by mouth. 10/09/14   Loleta Rose, MD  FLUoxetine (PROZAC) 20 MG tablet Take 20 mg by mouth daily.    [provider]  hydrochlorothiazide (HYDRODIURIL) 12.5 MG tablet TAKE 1 TABLET BY MOUTH IN THE MORNING FOR BLOOD PRESSURE 09/11/14   Alba Afsa Meany, MD  ibuprofen (ADVIL,MOTRIN) 800 MG tablet Take 1  tablet (800 mg total) by mouth every 8 (eight) hours as needed for moderate pain. 11/24/14   Irean Hong, MD  metoprolol succinate (TOPROL-XL) 25 MG 24 hr tablet TAKE 1 TABLET BY MOUTH EVERY DAY FOR BLOOD PRESSURE AND PALPITATIONS 09/11/14   Alba Mario Voong, MD  ondansetron (ZOFRAN ODT) 4 MG disintegrating tablet Allow 1-2 tablets to dissolve in your mouth every 8 hours as needed for nausea/vomiting 10/26/15   Loleta Rose, MD  oxyCODONE-acetaminophen (ROXICET) 5-325 MG tablet Take 1 tablet by mouth every 6 (six) hours as needed. 03/16/15   Jeanmarie Plant, MD  predniSONE (DELTASONE) 20 MG tablet Take 2 tablets (40 mg total) by mouth daily. 11/04/15 11/09/15  Willy Eddy, MD  predniSONE (DELTASONE) 50 MG tablet One tablet by mouth daily 11/12/14   Emily Filbert, MD  promethazine (PHENERGAN) 12.5 MG tablet Take 1 tablet (12.5 mg total) by mouth every 6 (six) hours as needed for nausea or vomiting. 11/04/15   Willy Eddy, MD  traZODone (DESYREL) 100 MG tablet Take 50 mg by mouth at bedtime.    [provider]    Allergies Depakote [divalproex sodium]; Tegretol [carbamazepine]; and Cepacol [cetylpyridinium]  Family History  Problem Relation Age of Onset  . Hypertension Mother   . Leukemia Mother   . Diabetes Mother   . Heart murmur Mother   . Heart attack Mother   . Heart failure Mother     Social History Social History   Tobacco Use  . Smoking status: Current Every Day Smoker    Types: Cigars  . Smokeless tobacco: Never Used  Substance Use Topics  . Alcohol use: Yes    Comment: occassionally  . Drug use: Yes    Types: Marijuana, Cocaine    Review of Systems Constitutional: No fever/chills ENT: No sore throat. Cardiovascular: Denies chest pain. Respiratory: Denies shortness of breath.  No cough Gastrointestinal: No abdominal pain.  No nausea, no vomiting.  No diarrhea.   Musculoskeletal: Negative for neck pain.  Negative for back pain. Integumentary:  Itchy rash on her neck x2 days.  No pain. Neurological: Negative for headaches, focal weakness or numbness.   ____________________________________________   PHYSICAL EXAM:  VITAL SIGNS: ED Triage Vitals [06/15/17 2235]  Enc Vitals Group     BP 133/71     Pulse Rate 81     Resp 18     Temp 97.8 F (36.6 C)     Temp Source Oral     SpO2 100 %     Weight 127 kg (280 lb)     Height 1.651 m (5\' 5" )     Head Circumference      Peak Flow      Pain Score 0     Pain Loc      Pain Edu?      Excl. in GC?  Constitutional: Alert and oriented. Well appearing and in no acute distress. Eyes: Conjunctivae are normal.  Head: Atraumatic. Nose: No congestion/rhinnorhea. Mouth/Throat: Mucous membranes moist with no mucosal rash involvement Neck: No stridor.  No meningeal signs.   Cardiovascular: Normal rate, regular rhythm. Good peripheral circulation. Respiratory: Normal respiratory effort.  No retractions.  Neurologic:  Normal speech and language. No gross focal neurologic deficits are appreciated.  Skin:  Skin is warm, dry and intact.  The patient has a sandpapery, nonerythematous, non-vesicular rash on the back of her neck and on the anterior part of her neck, primarily in the left side.  Is not consistent with any obvious emergent medical conditions such as Stevens-Johnson syndrome, erythema nodosum, erythema migrans, cellulitis, contact dermatitis, etc.  It does not appear completely consistent with eczema or psoriasis but that is most similar in appearance to what the patient has. Psychiatric: Mood and affect are normal. Speech and behavior are normal.  ____________________________________________   LABS (all labs ordered are listed, but only abnormal results are displayed)  Labs Reviewed - No data to display ____________________________________________  EKG  None - EKG not ordered by ED physician ____________________________________________  RADIOLOGY   ED MD  interpretation: No indication for imaging  Official radiology report(s): No results found.  ____________________________________________   PROCEDURES  Critical Care performed: No   Procedure(s) performed:   Procedures   ____________________________________________   INITIAL IMPRESSION / ASSESSMENT AND PLAN / ED COURSE  As part of my medical decision making, I reviewed the following data within the electronic MEDICAL RECORD NUMBER Nursing notes reviewed and incorporated, Old chart reviewed and Notes from prior ED visits    No evidence of acute or emergent cause of rash.  No evidence of insect bite or infectious process.  It may be atopic and I think would likely benefit from some steroids and Benadryl.  I explained to her the plan which is dexamethasone 10 mg by mouth rather than a course of prednisone and over-the-counter hydrocortisone ointment and Benadryl as needed.  I encouraged her to follow-up with her primary care doctor and/or dermatology and I provided contact information for a dermatology clinic.  She states that she understands and agrees with the plan.     ____________________________________________  FINAL CLINICAL IMPRESSION(S) / ED DIAGNOSES  Final diagnoses:  Rash     MEDICATIONS GIVEN DURING THIS VISIT:  Medications  diphenhydrAMINE (BENADRYL) capsule 25 mg (has no administration in time range)  dexamethasone (DECADRON) 10 MG/ML injection for Pediatric ORAL use 10 mg (has no administration in time range)     ED Discharge Orders    None       Note:  This document was prepared using Dragon voice recognition software and may include unintentional dictation errors.    Loleta Rose, MD 06/16/17 [phone]

## 2017-06-16 NOTE — Discharge Instructions (Signed)
Please try using both your Eucerin lotion and a small amount of hydrocortisone ointment or cream on the rash.  Take over-the-counter Benadryl as needed according to the label instructions as needed.  Follow up with your regular doctor or with the skin specialists.  Return to the emergency department if you develop new or worsening symptoms that concern you.

## 2017-06-16 NOTE — ED Notes (Signed)
Pt states she has broken out on her entire neck with a rash. Started 2 days ago. Pt denies pain but states that it itches and is red. Family at bedside. Pt is alert and oriented x 4.

## 2017-11-10 ENCOUNTER — Other Ambulatory Visit: Payer: Self-pay

## 2017-11-10 ENCOUNTER — Emergency Department
Admission: EM | Admit: 2017-11-10 | Discharge: 2017-11-10 | Disposition: A | Discharge status: Home / Self Care | Payer: Medicaid Other | Attending: Emergency Medicine | Admitting: Emergency Medicine

## 2017-11-10 ENCOUNTER — Encounter: Payer: Self-pay | Admitting: Emergency Medicine

## 2017-11-10 DIAGNOSIS — F17290 Nicotine dependence, other tobacco product, uncomplicated: Secondary | ICD-10-CM | POA: Insufficient documentation

## 2017-11-10 DIAGNOSIS — I10 Essential (primary) hypertension: Secondary | ICD-10-CM | POA: Diagnosis not present

## 2017-11-10 DIAGNOSIS — J069 Acute upper respiratory infection, unspecified: Secondary | ICD-10-CM | POA: Insufficient documentation

## 2017-11-10 DIAGNOSIS — J209 Acute bronchitis, unspecified: Secondary | ICD-10-CM

## 2017-11-10 DIAGNOSIS — Z79899 Other long term (current) drug therapy: Secondary | ICD-10-CM | POA: Insufficient documentation

## 2017-11-10 DIAGNOSIS — R0981 Nasal congestion: Secondary | ICD-10-CM | POA: Diagnosis present

## 2017-11-10 MED ORDER — PSEUDOEPH-BROMPHEN-DM 30-2-10 MG/5ML PO SYRP: 5.0000 mL | ORAL_SOLUTION | Freq: Four times a day (QID) | ORAL | 0 refills | Status: DC | PRN

## 2017-11-10 MED ORDER — FLUCONAZOLE 150 MG PO TABS: 150.0000 mg | ORAL_TABLET | Freq: Every day | ORAL | 0 refills | Status: DC

## 2017-11-10 MED ORDER — PREDNISONE 10 MG PO TABS: ORAL_TABLET | ORAL | 0 refills | Status: DC

## 2017-11-10 MED ORDER — AMOXICILLIN 875 MG PO TABS: 875.0000 mg | ORAL_TABLET | Freq: Two times a day (BID) | ORAL | 0 refills | Status: DC

## 2017-11-10 NOTE — Discharge Instructions (Addendum)
Follow-up with your primary care provider if any continued problems.  Also discontinue smoking as this aggravates and increases your complications with bronchitis.  Begin taking prednisone 30 mg daily for the next 5 days, Bromfed-DM for cough and congestion, and amoxicillin 875 twice daily for 10 days.  Increase fluids.  Take Tylenol or ibuprofen as needed for fever or body aches.  Discontinue taking your over-the-counter medication such as Mucinex and Tylenol Cold and flu.

## 2017-11-10 NOTE — ED Provider Notes (Signed)
[facility] Emergency Department Provider Note  ____________________________________________   First MD Initiated Contact with Patient 11/10/17 1003     (approximate)  I have reviewed the triage vital signs and the nursing notes.   HISTORY  Chief Complaint Nasal Congestion and Cough  HPI Jeanette Alexander is a 27 y.o. female presents to the ED with complaint of nasal congestion, sore throat, productive cough and body aches.  Patient states symptoms started 1 week ago and has gotten worse.  Patient voices that she has had chills and is unaware of any fever.  She continues to have body aches.  She takes Tylenol Cold and flu and Mucinex without any relief.  Patient has a history of bronchitis in the past.  She also endorses that she smokes "drugs".  She rates her pain as 7 out of 10.   Past Medical History:  Diagnosis Date  . Acquired acanthosis nigricans   . Allergic rhinitis, cause unspecified   . Anemia, unspecified   . Bipolar disorder, unspecified (HCC)   . Cough variant asthma   . Dysthymic disorder   . Essential hypertension, benign   . Headache(784.0)   . Hypertension   . Major depressive disorder, recurrent episode, moderate (HCC)   . Migraine, unspecified, without mention of intractable migraine without mention of status migrainosus   . Obesity, unspecified   . Other nonspecific abnormal finding of lung field   . Panic disorder     Patient Active Problem List   Diagnosis Date Noted  . H/O: HTN (hypertension) 09/03/2014  . Depression, major, recurrent, moderate (HCC) 09/03/2014  . H/O: obesity 09/03/2014  . Episodic paroxysmal anxiety disorder 09/03/2014  . SOB (shortness of breath) 10/16/2013  . Heart palpitations 09/27/2013    Past Surgical History:  Procedure Laterality Date  . CESAREAN SECTION    . TONSILLECTOMY      Prior to Admission medications   Medication Sig Start Date End Date Taking? Authorizing Provider  amoxicillin  (AMOXIL) 875 MG tablet Take 1 tablet (875 mg total) by mouth 2 (two) times daily. 11/10/17   Tommi RumpsSummers, Lailie Smead L, PA-C  brompheniramine-pseudoephedrine-DM 30-2-10 MG/5ML syrup Take 5 mLs by mouth 4 (four) times daily as needed. 11/10/17   Tommi RumpsSummers, Gwenevere Goga L, PA-C  hydrochlorothiazide (HYDRODIURIL) 12.5 MG tablet TAKE 1 TABLET BY MOUTH IN THE MORNING FOR BLOOD PRESSURE 09/11/14   Alba CorySowles, Krichna, MD  metoprolol succinate (TOPROL-XL) 25 MG 24 hr tablet TAKE 1 TABLET BY MOUTH EVERY DAY FOR BLOOD PRESSURE AND PALPITATIONS 09/11/14   Alba CorySowles, Krichna, MD  predniSONE (DELTASONE) 10 MG tablet Take 3 tablets every day for 5 days 11/10/17   Tommi RumpsSummers, Kaileb Monsanto L, PA-C    Allergies Depakote [divalproex sodium]; Tegretol [carbamazepine]; and Cepacol [cetylpyridinium]  Family History  Problem Relation Age of Onset  . Hypertension Mother   . Leukemia Mother   . Diabetes Mother   . Heart murmur Mother   . Heart attack Mother   . Heart failure Mother     Social History Social History   Tobacco Use  . Smoking status: Current Every Day Smoker    Types: Cigars  . Smokeless tobacco: Never Used  Substance Use Topics  . Alcohol use: Yes    Comment: occassionally  . Drug use: Yes    Types: Marijuana, Cocaine    Review of Systems Constitutional: No fever/positive chills Eyes: No visual changes. ENT: Positive nasal congestion, positive sore throat and ear pain. Cardiovascular: Denies chest pain. Respiratory: Denies shortness of  breath.  Positive cough. Gastrointestinal: No abdominal pain.  No nausea, no vomiting.  Musculoskeletal: Negative for back pain. Skin: Negative for rash. Neurological: Negative for headaches, focal weakness or numbness. ___________________________________________   PHYSICAL EXAM:  VITAL SIGNS: ED Triage Vitals  Enc Vitals Group     BP 11/10/17 0901 121/64     Pulse Rate 11/10/17 0901 74     Resp 11/10/17 0901 18     Temp 11/10/17 0901 98 F (36.7 C)     Temp Source  11/10/17 0901 Oral     SpO2 11/10/17 0901 100 %     Weight 11/10/17 0900 275 lb 9.2 oz (125 kg)     Height 11/10/17 0900 5\' 4"  (1.626 m)     Head Circumference --      Peak Flow --      Pain Score 11/10/17 0900 7     Pain Loc --      Pain Edu? --      Excl. in GC? --     Constitutional: Alert and oriented. Well appearing and in no acute distress. Eyes: Conjunctivae are normal.  Head: Atraumatic. Nose: Positive congestion/rhinnorhea. Mouth/Throat: Mucous membranes are moist.  Oropharynx non-erythematous.  Uvula is midline and no exudate was seen.  Minimal tenderness on percussion of the maxillary sinuses. Neck: No stridor.   Hematological/Lymphatic/Immunilogical: No cervical lymphadenopathy. Cardiovascular: Normal rate, regular rhythm. Grossly normal heart sounds.  Good peripheral circulation. Respiratory: Normal respiratory effort.  No retractions. Lungs CTAB. Gastrointestinal: Soft and nontender. No distention.  Musculoskeletal: Moves upper and lower extremities with any difficulty.  Normal gait was noted. Neurologic:  Normal speech and language. No gross focal neurologic deficits are appreciated.  Skin:  Skin is warm, dry and intact. No rash noted. Psychiatric: Mood and affect are normal. Speech and behavior are normal.  ____________________________________________   LABS (all labs ordered are listed, but only abnormal results are displayed)  Labs Reviewed - No data to display  PROCEDURES  Procedure(s) performed: None  Procedures  Critical Care performed: No  ____________________________________________   INITIAL IMPRESSION / ASSESSMENT AND PLAN / ED COURSE  As part of my medical decision making, I reviewed the following data within the electronic MEDICAL RECORD NUMBER Notes from prior ED visits and [place] Controlled Substance Database  Patient presents to the ED with complaint of upper story symptoms, chills along with some body aches for approximately 1 week.  Patient has  been taking over-the-counter medication without any relief.  She states she has a history of bronchitis in the past.  Physical exam is consistent with a upper respiratory infection and patient has a raspy congested cough.  Patient was given a prescription for prednisone 30 mg daily for the next 5 days, Bromfed-DM for cough and congestion and amoxicillin 875 twice daily for 10 days.  Is encouraged to discontinue smoking.  She is to follow-up with her PCP if any continued problems.  ____________________________________________   FINAL CLINICAL IMPRESSION(S) / ED DIAGNOSES  Final diagnoses:  Acute bronchitis, unspecified organism  Upper respiratory tract infection, unspecified type     ED Discharge Orders         Ordered    predniSONE (DELTASONE) 10 MG tablet     11/10/17 1029    brompheniramine-pseudoephedrine-DM 30-2-10 MG/5ML syrup  4 times daily PRN     11/10/17 1029    amoxicillin (AMOXIL) 875 MG tablet  2 times daily     11/10/17 1029  Note:  This document was prepared using Dragon voice recognition software and may include unintentional dictation errors.    Tommi Rumps, PA-C 11/10/17 1036    Jeanmarie Plant, MD 11/10/17 1529

## 2017-11-10 NOTE — ED Notes (Signed)
See triage note  presents with nasal congestion,congestion,sore throat and ear pain.  Also has had some body aches subjective fever  Afebrile on arrival

## 2017-11-10 NOTE — ED Triage Notes (Signed)
Here for cough and congestion X 1 week.  No fevers. VSS. Unlabored. Has had chills. Generalized body aches.

## 2018-02-07 ENCOUNTER — Encounter: Payer: Self-pay | Admitting: Emergency Medicine

## 2018-02-07 ENCOUNTER — Emergency Department
Admission: EM | Admit: 2018-02-07 | Discharge: 2018-02-07 | Disposition: A | Discharge status: Home / Self Care | Payer: Medicaid Other | Attending: Emergency Medicine | Admitting: Emergency Medicine

## 2018-02-07 DIAGNOSIS — I10 Essential (primary) hypertension: Secondary | ICD-10-CM | POA: Diagnosis not present

## 2018-02-07 DIAGNOSIS — R300 Dysuria: Secondary | ICD-10-CM | POA: Diagnosis present

## 2018-02-07 DIAGNOSIS — F17290 Nicotine dependence, other tobacco product, uncomplicated: Secondary | ICD-10-CM | POA: Diagnosis not present

## 2018-02-07 DIAGNOSIS — N760 Acute vaginitis: Secondary | ICD-10-CM | POA: Insufficient documentation

## 2018-02-07 DIAGNOSIS — B9689 Other specified bacterial agents as the cause of diseases classified elsewhere: Secondary | ICD-10-CM | POA: Diagnosis not present

## 2018-02-07 DIAGNOSIS — Z79899 Other long term (current) drug therapy: Secondary | ICD-10-CM | POA: Insufficient documentation

## 2018-02-07 LAB — WET PREP, GENITAL
Sperm: NONE SEEN
Trich, Wet Prep: NONE SEEN
Yeast Wet Prep HPF POC: NONE SEEN

## 2018-02-07 LAB — URINALYSIS, COMPLETE (UACMP) WITH MICROSCOPIC
BILIRUBIN URINE: NEGATIVE
Bacteria, UA: NONE SEEN
GLUCOSE, UA: NEGATIVE mg/dL
KETONES UR: NEGATIVE mg/dL
Leukocytes, UA: NEGATIVE
NITRITE: NEGATIVE
PROTEIN: NEGATIVE mg/dL
Specific Gravity, Urine: 1.016 (ref 1.005–1.030)
pH: 6 (ref 5.0–8.0)

## 2018-02-07 MED ORDER — METRONIDAZOLE 500 MG PO TABS: 500.0000 mg | ORAL_TABLET | Freq: Two times a day (BID) | ORAL | 0 refills | Status: AC

## 2018-02-07 NOTE — ED Triage Notes (Signed)
Pt reports for the past week has had dysuria and vaginal irritation. Reports tried monistat with no relief. Denies vaginal discharge.

## 2018-02-07 NOTE — ED Provider Notes (Signed)
[facility] Emergency Department Provider Note ____________________________________________  Time seen: [id number]  I have reviewed the triage vital signs and the nursing notes.  HISTORY  Chief Complaint  Vaginal Itching and Dysuria  HPI Jeanette Alexander is a 27 y.o. female who presents to the ED accompanied by her girlfriend, for evaluation of vulvar irritation.  Patient is in a monogamous lesbian relationship, and reports some local vulvar irritation that she assumed was related to yeast.  She used an over-the-counter Monistat treatment, but denies any significant benefit.  She denies any dysuria, vaginal discharge, or pelvic pain.  She has noted when she wipes she seen some pink blood on the toilet tissue.  She does report however, a history of bleeding in between periods.  She denies any fevers, nausea, vomiting, or diarrhea.  Past Medical History:  Diagnosis Date  . Acquired acanthosis nigricans   . Allergic rhinitis, cause unspecified   . Anemia, unspecified   . Bipolar disorder, unspecified (HCC)   . Cough variant asthma   . Dysthymic disorder   . Essential hypertension, benign   . Headache(784.0)   . Hypertension   . Major depressive disorder, recurrent episode, moderate (HCC)   . Migraine, unspecified, without mention of intractable migraine without mention of status migrainosus   . Obesity, unspecified   . Other nonspecific abnormal finding of lung field   . Panic disorder     Patient Active Problem List   Diagnosis Date Noted  . H/O: HTN (hypertension) 09/03/2014  . Depression, major, recurrent, moderate (HCC) 09/03/2014  . H/O: obesity 09/03/2014  . Episodic paroxysmal anxiety disorder 09/03/2014  . SOB (shortness of breath) 10/16/2013  . Heart palpitations 09/27/2013    Past Surgical History:  Procedure Laterality Date  . CESAREAN SECTION    . TONSILLECTOMY      Prior to Admission medications   Medication Sig Start Date End Date Taking?  Authorizing Provider  hydrochlorothiazide (HYDRODIURIL) 12.5 MG tablet TAKE 1 TABLET BY MOUTH IN THE MORNING FOR BLOOD PRESSURE 09/11/14   Alba CorySowles, Krichna, MD  metoprolol succinate (TOPROL-XL) 25 MG 24 hr tablet TAKE 1 TABLET BY MOUTH EVERY DAY FOR BLOOD PRESSURE AND PALPITATIONS 09/11/14   Alba CorySowles, Krichna, MD  metroNIDAZOLE (FLAGYL) 500 MG tablet Take 1 tablet (500 mg total) by mouth 2 (two) times daily for 7 days. 02/07/18 02/14/18  Blossie Raffel, Charlesetta IvoryJenise V Bacon, PA-C    Allergies Depakote [divalproex sodium]; Tegretol [carbamazepine]; and Cepacol [cetylpyridinium]  Family History  Problem Relation Age of Onset  . Hypertension Mother   . Leukemia Mother   . Diabetes Mother   . Heart murmur Mother   . Heart attack Mother   . Heart failure Mother     Social History Social History   Tobacco Use  . Smoking status: Current Every Day Smoker    Types: Cigars  . Smokeless tobacco: Never Used  Substance Use Topics  . Alcohol use: Yes    Comment: occassionally  . Drug use: Yes    Types: Marijuana, Cocaine    Review of Systems  Constitutional: Negative for fever. Cardiovascular: Negative for chest pain. Respiratory: Negative for shortness of breath. Gastrointestinal: Negative for abdominal pain, vomiting and diarrhea. Genitourinary: Negative for dysuria. Vulvar irritation as above Musculoskeletal: Negative for back pain. Skin: Negative for rash. Neurological: Negative for headaches, focal weakness or numbness. ____________________________________________  PHYSICAL EXAM:  VITAL SIGNS: ED Triage Vitals  Enc Vitals Group     BP 02/07/18 0941 (!) 147/96  Pulse Rate 02/07/18 0941 80     Resp 02/07/18 0941 20     Temp 02/07/18 0941 98.2 F (36.8 C)     Temp Source 02/07/18 0941 Oral     SpO2 02/07/18 0941 100 %     Weight 02/07/18 0940 280 lb (127 kg)     Height 02/07/18 0940 5\' 5"  (1.651 m)     Head Circumference --      Peak Flow --      Pain Score 02/07/18 0940 5     Pain  Loc --      Pain Edu? --      Excl. in GC? --     Constitutional: Alert and oriented. Well appearing and in no distress. Head: Normocephalic and atraumatic. Eyes: Conjunctivae are normal. Normal extraocular movements Cardiovascular: Normal rate, regular rhythm. Normal distal pulses. Respiratory: Normal respiratory effort. No wheezes/rales/rhonchi. GU: Deferred. Patient-collected wet prep Musculoskeletal: Nontender with normal range of motion in all extremities.  Neurologic:  Normal gait without ataxia. Normal speech and language. No gross focal neurologic deficits are appreciated. Skin:  Skin is warm, dry and intact. No rash noted. Psychiatric: Mood and affect are normal. Patient exhibits appropriate insight and judgment. ____________________________________________   LABS (pertinent positives/negatives)  Labs Reviewed  WET PREP, GENITAL - Abnormal; Notable for the following components:      Result Value   Clue Cells Wet Prep HPF POC PRESENT (*)    WBC, Wet Prep HPF POC FEW (*)    All other components within normal limits  URINALYSIS, COMPLETE (UACMP) WITH MICROSCOPIC - Abnormal; Notable for the following components:   Color, Urine YELLOW (*)    APPearance CLOUDY (*)    Hgb urine dipstick LARGE (*)    All other components within normal limits  ____________________________________________  PROCEDURES  Procedures ____________________________________________  INITIAL IMPRESSION / ASSESSMENT AND PLAN / ED COURSE  Patient with ED evaluation of vulvar irritation.  Her wet prep confirms clue cells patient treated empirically for BV with metronidazole tablets.  She will follow-up with primary provider for ongoing symptoms. ____________________________________________  FINAL CLINICAL IMPRESSION(S) / ED DIAGNOSES  Final diagnoses:  BV (bacterial vaginosis)      Karmen Stabs, Charlesetta Ivory, PA-C 02/07/18 1155    Governor Rooks, MD 02/07/18 1401

## 2018-02-07 NOTE — ED Notes (Signed)
Pt presents to ED from home with c/c of vaginal itching and dysuria. Sx onset approx 10 days ago. Pt states that her last cycle began 2 wks ago and she normally has some itching towards the end but this is far worse and has lasted long past the end of her cycle. She describes that over the last few days the itching has gotten worse when she urinates. Pt describes increased frequency but no changes in urine color/odor.

## 2018-02-07 NOTE — Discharge Instructions (Signed)
Take the antibiotic as directed. Avoid alcohol while taking this medicine. See your provider for follow-up as needed.

## 2018-04-20 ENCOUNTER — Encounter: Payer: Self-pay | Admitting: Emergency Medicine

## 2018-04-20 ENCOUNTER — Other Ambulatory Visit: Payer: Self-pay

## 2018-04-20 ENCOUNTER — Emergency Department
Admission: EM | Admit: 2018-04-20 | Discharge: 2018-04-20 | Disposition: A | Discharge status: Home / Self Care | Payer: Medicaid Other | Attending: Emergency Medicine | Admitting: Emergency Medicine

## 2018-04-20 DIAGNOSIS — I10 Essential (primary) hypertension: Secondary | ICD-10-CM | POA: Insufficient documentation

## 2018-04-20 DIAGNOSIS — R42 Dizziness and giddiness: Secondary | ICD-10-CM | POA: Insufficient documentation

## 2018-04-20 DIAGNOSIS — F17210 Nicotine dependence, cigarettes, uncomplicated: Secondary | ICD-10-CM | POA: Diagnosis not present

## 2018-04-20 DIAGNOSIS — F319 Bipolar disorder, unspecified: Secondary | ICD-10-CM | POA: Diagnosis not present

## 2018-04-20 LAB — CBC
HCT: 35.7 % — ABNORMAL LOW (ref 36.0–46.0)
Hemoglobin: 11.3 g/dL — ABNORMAL LOW (ref 12.0–15.0)
MCH: 23.3 pg — AB (ref 26.0–34.0)
MCHC: 31.7 g/dL (ref 30.0–36.0)
MCV: 73.5 fL — AB (ref 80.0–100.0)
Platelets: 465 10*3/uL — ABNORMAL HIGH (ref 150–400)
RBC: 4.86 MIL/uL (ref 3.87–5.11)
RDW: 19 % — AB (ref 11.5–15.5)
WBC: 6.6 10*3/uL (ref 4.0–10.5)
nRBC: 0 % (ref 0.0–0.2)

## 2018-04-20 LAB — URINALYSIS, COMPLETE (UACMP) WITH MICROSCOPIC
Bacteria, UA: NONE SEEN
Bilirubin Urine: NEGATIVE
Glucose, UA: NEGATIVE mg/dL
Ketones, ur: NEGATIVE mg/dL
Leukocytes,Ua: NEGATIVE
Nitrite: NEGATIVE
PH: 5 (ref 5.0–8.0)
Protein, ur: NEGATIVE mg/dL
RBC / HPF: >50 RBC/hpf — ABNORMAL HIGH (ref 0–5)
SPECIFIC GRAVITY, URINE: 1.025 (ref 1.005–1.030)

## 2018-04-20 LAB — BASIC METABOLIC PANEL
Anion gap: 9 (ref 5–15)
BUN: 12 mg/dL (ref 6–20)
CO2: 22 mmol/L (ref 22–32)
CREATININE: 0.6 mg/dL (ref 0.44–1.00)
Calcium: 8.8 mg/dL — ABNORMAL LOW (ref 8.9–10.3)
Chloride: 104 mmol/L (ref 98–111)
GFR calc Af Amer: >60 mL/min (ref >60)
GFR calc non Af Amer: >60 mL/min (ref >60)
GLUCOSE: 100 mg/dL — AB (ref 70–99)
Potassium: 3.9 mmol/L (ref 3.5–5.1)
Sodium: 135 mmol/L (ref 135–145)

## 2018-04-20 LAB — POCT PREGNANCY, URINE: PREG TEST UR: NEGATIVE

## 2018-04-20 LAB — TROPONIN I: Troponin I: <0.03 ng/mL (ref <0.03)

## 2018-04-20 MED ORDER — DIPHENHYDRAMINE HCL 25 MG PO CAPS
25.0000 mg | ORAL_CAPSULE | Freq: Once | ORAL | Status: AC
  Administered 2018-04-20: 25 mg via ORAL
  Filled 2018-04-20: qty 1

## 2018-04-20 MED ORDER — SODIUM CHLORIDE 0.9% FLUSH: 3.0000 mL | Freq: Once | INTRAVENOUS | Status: DC

## 2018-04-20 NOTE — ED Provider Notes (Signed)
[facility] Emergency Department Provider Note  ____________________________________________   I have reviewed the triage vital signs and the nursing notes. Where available I have reviewed prior notes and, if possible and indicated, outside hospital notes.    HISTORY  Chief Complaint Dizziness    HPI Jeanette Alexander is a 28 y.o. female  who woke up this morning, felt that her forehead felt a little bit swollen, and then felt lightheaded.  She feels fine now she wants some Benadryl in case the swelling comes back on her forehead.  It was in the hairline just above the right eye.  No significant headache no nausea no vomiting diarrhea, this happened in the middle the night when she woke up.  She states she felt something on her forehead and then became very anxious, at this time she is much more relaxed and would like to go home.  No other complaints no focal numbness no weakness no stiff neck no headache no fever no diarrhea no vomiting no chest pain no shortness of breath no PE symptoms no other complaints no true vertigo.    Past Medical History:  Diagnosis Date  . Acquired acanthosis nigricans   . Allergic rhinitis, cause unspecified   . Anemia, unspecified   . Bipolar disorder, unspecified (HCC)   . Cough variant asthma   . Dysthymic disorder   . Essential hypertension, benign   . Headache(784.0)   . Hypertension   . Major depressive disorder, recurrent episode, moderate (HCC)   . Migraine, unspecified, without mention of intractable migraine without mention of status migrainosus   . Obesity, unspecified   . Other nonspecific abnormal finding of lung field   . Panic disorder     Patient Active Problem List   Diagnosis Date Noted  . H/O: HTN (hypertension) 09/03/2014  . Depression, major, recurrent, moderate (HCC) 09/03/2014  . H/O: obesity 09/03/2014  . Episodic paroxysmal anxiety disorder 09/03/2014  . SOB (shortness of breath) 10/16/2013  .  Heart palpitations 09/27/2013    Past Surgical History:  Procedure Laterality Date  . CESAREAN SECTION    . TONSILLECTOMY      Prior to Admission medications   Medication Sig Start Date End Date Taking? Authorizing Provider  hydrochlorothiazide (HYDRODIURIL) 12.5 MG tablet TAKE 1 TABLET BY MOUTH IN THE MORNING FOR BLOOD PRESSURE 09/11/14   Alba Cory, MD  metoprolol succinate (TOPROL-XL) 25 MG 24 hr tablet TAKE 1 TABLET BY MOUTH EVERY DAY FOR BLOOD PRESSURE AND PALPITATIONS 09/11/14   Alba Cory, MD    Allergies Depakote [divalproex sodium]; Tegretol [carbamazepine]; and Cepacol [cetylpyridinium]  Family History  Problem Relation Age of Onset  . Hypertension Mother   . Leukemia Mother   . Diabetes Mother   . Heart murmur Mother   . Heart attack Mother   . Heart failure Mother     Social History Social History   Tobacco Use  . Smoking status: Current Every Day Smoker    Types: Cigars  . Smokeless tobacco: Never Used  Substance Use Topics  . Alcohol use: Yes    Comment: occassionally  . Drug use: Yes    Types: Marijuana, Cocaine    Review of Systems Constitutional: No fever/chills Eyes: No visual changes. ENT: No sore throat. No stiff neck no neck pain Cardiovascular: Denies chest pain. Respiratory: Denies shortness of breath. Gastrointestinal:   no vomiting.  No diarrhea.  No constipation. Genitourinary: Negative for dysuria. Musculoskeletal: Negative lower extremity swelling Skin: Negative for rash. Neurological:  Negative for severe headaches, focal weakness or numbness.   ____________________________________________   PHYSICAL EXAM:  VITAL SIGNS: ED Triage Vitals [04/20/18 0630]  Enc Vitals Group     BP 134/75     Pulse Rate 72     Resp 19     Temp 98.6 F (37 C)     Temp Source Oral     SpO2 100 %     Weight 294 lb (133.4 kg)     Height 5\' 4"  (1.626 m)     Head Circumference      Peak Flow      Pain Score 0     Pain Loc      Pain  Edu?      Excl. in GC?     Constitutional: Alert and oriented. Well appearing and in no acute distress.  Is laughing and joking with me and her family member, requesting discharge Eyes: Conjunctivae are normal Head: Atraumatic, I cannot perceive the bump that she thinks she has on her forehead HEENT: No congestion/rhinnorhea. Mucous membranes are moist.  Oropharynx non-erythematous Neck:   Nontender with no meningismus, no masses, no stridor Cardiovascular: Normal rate, regular rhythm. Grossly normal heart sounds.  Good peripheral circulation. Respiratory: Normal respiratory effort.  No retractions. Lungs CTAB. Abdominal: Soft and nontender. No distention. No guarding no rebound Back:  There is no focal tenderness or step off.  there is no midline tenderness there are no lesions noted. there is no CVA tenderness Musculoskeletal: No lower extremity tenderness, no upper extremity tenderness. No joint effusions, no DVT signs strong distal pulses no edema Neurologic:  Normal speech and language. No gross focal neurologic deficits are appreciated.  Skin:  Skin is warm, dry and intact. No rash noted. Psychiatric: Mood and affect are normal. Speech and behavior are normal.  ____________________________________________   LABS (all labs ordered are listed, but only abnormal results are displayed)  Labs Reviewed  BASIC METABOLIC PANEL - Abnormal; Notable for the following components:      Result Value   Glucose, Bld 100 (*)    Calcium 8.8 (*)    All other components within normal limits  CBC - Abnormal; Notable for the following components:   Hemoglobin 11.3 (*)    HCT 35.7 (*)    MCV 73.5 (*)    MCH 23.3 (*)    RDW 19.0 (*)    Platelets 465 (*)    All other components within normal limits  URINALYSIS, COMPLETE (UACMP) WITH MICROSCOPIC - Abnormal; Notable for the following components:   Color, Urine YELLOW (*)    APPearance CLEAR (*)    Hgb urine dipstick LARGE (*)    RBC / HPF >50 (*)     All other components within normal limits  TROPONIN I  POC URINE PREG, ED  POCT PREGNANCY, URINE    Pertinent labs  results that were available during my care of the patient were reviewed by me and considered in my medical decision making (see chart for details). ____________________________________________  EKG  I personally interpreted any EKGs ordered by me or triage Sinus rhythm rate 80 bpm, no acute ST elevation depression, normal axis nonspecific EKG changes ____________________________________________  RADIOLOGY  Pertinent labs & imaging results that were available during my care of the patient were reviewed by me and considered in my medical decision making (see chart for details). If possible, patient and/or family made aware of any abnormal findings.  No results found. ____________________________________________    PROCEDURES  Procedure(s) performed: None  Procedures  Critical Care performed: None  ____________________________________________   INITIAL IMPRESSION / ASSESSMENT AND PLAN / ED COURSE  Pertinent labs & imaging results that were available during my care of the patient were reviewed by me and considered in my medical decision making (see chart for details).  Patient here with a sensation that there was a bump on her forehead, which is no longer discernible by me if it arose, as well as a feeling of lightheadedness which is completely passed.  The very vague symptoms were very reassuring comprehensive work-up in the emergency room medical screening exam shows no acute emergent pathology, we will discharge with close outpatient follow-up and return precautions.  Patient is requesting Benadryl in case the pump comes back, and will think that will hurt her she is not driving she is advised not to drive after receiving Benadryl, she states her brother is coming to pick her up.  No evidence of cardiogenic pathology no evidence of neurogenic pathology no  evidence of pulmonary pathology such as PE, and she is not pregnant.  She has her.  There is some baseline hematuria at this time.  Does not seem to be related and her blood work is otherwise unremarkable.  Clinical Course as of Apr 20 898  Thu Apr 20, 2018  0831 ED EKG [KL]    Clinical Course User Index [KL] Eduard ClosLee, Kunhee, Student-PA   ____________________________________________   FINAL CLINICAL IMPRESSION(S) / ED DIAGNOSES  Final diagnoses:  Dizziness      This chart was dictated using voice recognition software.  Despite best efforts to proofread,  errors can occur which can change meaning.      Jeanmarie PlantMcShane, Waldine Zenz A, MD 04/20/18 [phone]

## 2018-04-20 NOTE — ED Triage Notes (Signed)
Patient woke up at 3am and noticed a lump on her head and was dizzy, lightheaded, nauseated and short of breath. Patient denies any pain

## 2018-04-22 DIAGNOSIS — Z79899 Other long term (current) drug therapy: Secondary | ICD-10-CM | POA: Insufficient documentation

## 2018-04-22 DIAGNOSIS — F17210 Nicotine dependence, cigarettes, uncomplicated: Secondary | ICD-10-CM | POA: Insufficient documentation

## 2018-04-22 DIAGNOSIS — F419 Anxiety disorder, unspecified: Secondary | ICD-10-CM | POA: Diagnosis not present

## 2018-04-22 DIAGNOSIS — I10 Essential (primary) hypertension: Secondary | ICD-10-CM | POA: Insufficient documentation

## 2018-04-22 DIAGNOSIS — R0789 Other chest pain: Secondary | ICD-10-CM | POA: Diagnosis not present

## 2018-04-22 DIAGNOSIS — R079 Chest pain, unspecified: Secondary | ICD-10-CM | POA: Diagnosis present

## 2018-04-22 NOTE — ED Triage Notes (Signed)
Patient reports chest pain all day with shortness of breath.

## 2018-04-23 ENCOUNTER — Emergency Department
Admission: EM | Admit: 2018-04-23 | Discharge: 2018-04-23 | Disposition: A | Discharge status: Home / Self Care | Payer: Medicaid Other | Attending: Emergency Medicine | Admitting: Emergency Medicine

## 2018-04-23 ENCOUNTER — Emergency Department: Payer: Medicaid Other

## 2018-04-23 ENCOUNTER — Other Ambulatory Visit: Payer: Self-pay

## 2018-04-23 DIAGNOSIS — R0789 Other chest pain: Secondary | ICD-10-CM

## 2018-04-23 DIAGNOSIS — F419 Anxiety disorder, unspecified: Secondary | ICD-10-CM

## 2018-04-23 LAB — BASIC METABOLIC PANEL
ANION GAP: 7 (ref 5–15)
BUN: 15 mg/dL (ref 6–20)
CHLORIDE: 102 mmol/L (ref 98–111)
CO2: 25 mmol/L (ref 22–32)
Calcium: 8.8 mg/dL — ABNORMAL LOW (ref 8.9–10.3)
Creatinine, Ser: 0.7 mg/dL (ref 0.44–1.00)
GFR calc Af Amer: >60 mL/min (ref >60)
GLUCOSE: 115 mg/dL — AB (ref 70–99)
POTASSIUM: 3.2 mmol/L — AB (ref 3.5–5.1)
Sodium: 134 mmol/L — ABNORMAL LOW (ref 135–145)

## 2018-04-23 LAB — CBC
HEMATOCRIT: 33.3 % — AB (ref 36.0–46.0)
HEMOGLOBIN: 10.5 g/dL — AB (ref 12.0–15.0)
MCH: 23.2 pg — AB (ref 26.0–34.0)
MCHC: 31.5 g/dL (ref 30.0–36.0)
MCV: 73.5 fL — ABNORMAL LOW (ref 80.0–100.0)
PLATELETS: 469 10*3/uL — AB (ref 150–400)
RBC: 4.53 MIL/uL (ref 3.87–5.11)
RDW: 18.6 % — ABNORMAL HIGH (ref 11.5–15.5)
WBC: 8.1 10*3/uL (ref 4.0–10.5)
nRBC: 0 % (ref 0.0–0.2)

## 2018-04-23 LAB — TROPONIN I: Troponin I: <0.03 ng/mL (ref <0.03)

## 2018-04-23 LAB — POCT PREGNANCY, URINE: Preg Test, Ur: NEGATIVE

## 2018-04-23 NOTE — ED Notes (Signed)
Pt waiting for treatment room; awake and alert

## 2018-04-23 NOTE — Discharge Instructions (Signed)
You have been seen in the Emergency Department (ED) today for chest pain.  As we have discussed todays test results are normal, and we feel it is likely that anxiety may have contributed to your symptoms.  Please follow up with the recommended doctor as instructed above in these documents regarding todays emergent visit and your recent symptoms to discuss further management.  Continue to take your regular medications.   Return to the Emergency Department (ED) if you experience any further chest pain/pressure/tightness, difficulty breathing, or sudden sweating, or other symptoms that concern you.

## 2018-04-23 NOTE — ED Provider Notes (Signed)
[facility] Emergency Department Provider Note  ____________________________________________   First MD Initiated Contact with Patient 04/23/18 [phone]     (approximate)  I have reviewed the triage vital signs and the nursing notes.   HISTORY  Chief Complaint Chest Pain    HPI   HPI: A 28 year old patient with a history of hypertension and obesity presents for evaluation of chest pain. Initial onset of pain was more than 6 hours ago. The patient's chest pain is well-localized and is worse with exertion. The patient complains of nausea. The patient's chest pain is middle- or left-sided, is not described as heaviness/pressure/tightness, is not sharp and does not radiate to the arms/jaw/neck. The patient denies diaphoresis. The patient has smoked in the past 90 days. The patient has no history of stroke, has no history of peripheral artery disease, denies any history of treated diabetes, has no relevant family history of coronary artery disease (first degree relative at less than age 89) and has no history of hypercholesterolemia.   Of note, the symptoms began after she took a Suboxone strip from a friend.  She has issues with anxiety which causes some of the same symptoms that she is describing above, and her friend told her that the Suboxone would make her feel better.  She has not used it in the past.  No shortness of breath.  No history of blood clots in the legs or the lungs.   Past Medical History:  Diagnosis Date  . Acquired acanthosis nigricans   . Allergic rhinitis, cause unspecified   . Anemia, unspecified   . Bipolar disorder, unspecified (HCC)   . Cough variant asthma   . Dysthymic disorder   . Essential hypertension, benign   . Headache(784.0)   . Hypertension   . Major depressive disorder, recurrent episode, moderate (HCC)   . Migraine, unspecified, without mention of intractable migraine without mention of status migrainosus   . Obesity,  unspecified   . Other nonspecific abnormal finding of lung field   . Panic disorder     Patient Active Problem List   Diagnosis Date Noted  . H/O: HTN (hypertension) 09/03/2014  . Depression, major, recurrent, moderate (HCC) 09/03/2014  . H/O: obesity 09/03/2014  . Episodic paroxysmal anxiety disorder 09/03/2014  . SOB (shortness of breath) 10/16/2013  . Heart palpitations 09/27/2013    Past Surgical History:  Procedure Laterality Date  . CESAREAN SECTION    . TONSILLECTOMY      Prior to Admission medications   Medication Sig Start Date End Date Taking? Authorizing Provider  hydrochlorothiazide (HYDRODIURIL) 12.5 MG tablet TAKE 1 TABLET BY MOUTH IN THE MORNING FOR BLOOD PRESSURE 09/11/14   Alba Mitra Duling, MD  metoprolol succinate (TOPROL-XL) 25 MG 24 hr tablet TAKE 1 TABLET BY MOUTH EVERY DAY FOR BLOOD PRESSURE AND PALPITATIONS 09/11/14   Alba Breena Bevacqua, MD    Allergies Depakote [divalproex sodium]; Tegretol [carbamazepine]; and Cepacol [cetylpyridinium]  Family History  Problem Relation Age of Onset  . Hypertension Mother   . Leukemia Mother   . Diabetes Mother   . Heart murmur Mother   . Heart attack Mother   . Heart failure Mother     Social History Social History   Tobacco Use  . Smoking status: Current Every Day Smoker    Types: Cigars  . Smokeless tobacco: Never Used  Substance Use Topics  . Alcohol use: Yes    Comment: occassionally  . Drug use: Yes    Types: Marijuana, Cocaine  Review of Systems Constitutional: No fever/chills Eyes: No visual changes. ENT: No sore throat. Cardiovascular: Chest pain as described above Respiratory: Denies shortness of breath. Gastrointestinal: No abdominal pain.  No nausea, no vomiting.  No diarrhea.  No constipation. Genitourinary: Negative for dysuria. Musculoskeletal: Negative for neck pain.  Negative for back pain. Integumentary: Negative for rash. Neurological: Negative for headaches, focal weakness or  numbness.   ____________________________________________   PHYSICAL EXAM:  VITAL SIGNS: ED Triage Vitals [04/22/18 2356]  Enc Vitals Group     BP (!) 142/72     Pulse Rate 70     Resp 18     Temp 97.8 F (36.6 C)     Temp Source Oral     SpO2 100 %     Weight 133.4 kg (294 lb)     Height 1.626 m (5\' 4" )     Head Circumference      Peak Flow      Pain Score 8     Pain Loc      Pain Edu?      Excl. in GC?     Constitutional: Alert and oriented. Well appearing and in no acute distress. Eyes: Conjunctivae are normal.  Head: Atraumatic. Nose: No congestion/rhinnorhea. Mouth/Throat: Mucous membranes are moist. Neck: No stridor.  No meningeal signs.   Cardiovascular: Normal rate, regular rhythm. Good peripheral circulation. Grossly normal heart sounds. Respiratory: Normal respiratory effort.  No retractions. Lungs CTAB. Gastrointestinal: Soft and nontender. No distention.  Musculoskeletal: No lower extremity tenderness nor edema. No gross deformities of extremities. Neurologic:  Normal speech and language. No gross focal neurologic deficits are appreciated.  Skin:  Skin is warm, dry and intact. No rash noted. Psychiatric: Mood and affect are normal. Speech and behavior are normal.  ____________________________________________   LABS (all labs ordered are listed, but only abnormal results are displayed)  Labs Reviewed  BASIC METABOLIC PANEL - Abnormal; Notable for the following components:      Result Value   Sodium 134 (*)    Potassium 3.2 (*)    Glucose, Bld 115 (*)    Calcium 8.8 (*)    All other components within normal limits  CBC - Abnormal; Notable for the following components:   Hemoglobin 10.5 (*)    HCT 33.3 (*)    MCV 73.5 (*)    MCH 23.2 (*)    RDW 18.6 (*)    Platelets 469 (*)    All other components within normal limits  TROPONIN I  TROPONIN I  POC URINE PREG, ED  POCT PREGNANCY, URINE    ____________________________________________  EKG  ED ECG REPORT I, Loleta Rose, the attending physician, personally viewed and interpreted this ECG.  Date: 04/22/2018 EKG Time: 23: 52 Rate: 71 Rhythm: normal sinus rhythm QRS Axis: normal Intervals: normal ST/T Wave abnormalities: normal Narrative Interpretation: no evidence of acute ischemia  ____________________________________________  RADIOLOGY I, Loleta Rose, personally viewed and evaluated these images (plain radiographs) as part of my medical decision making, as well as reviewing the written report by the radiologist.  ED MD interpretation: No indication of acute abnormality  Official radiology report(s): Dg Chest 2 View  Result Date: 04/23/2018 CLINICAL DATA:  28 y/o  F; chest pain and shortness of breath. EXAM: CHEST - 2 VIEW COMPARISON:  11/03/2015 chest radiograph FINDINGS: Stable cardiomegaly given projection and technique. Clear lungs. No pleural effusion or pneumothorax. No acute osseous abnormality is evident. IMPRESSION: Stable cardiomegaly. No acute pulmonary process identified. Electronically Signed  By: Mitzi Hansen M.D.   On: 04/23/2018 00:49    ____________________________________________   PROCEDURES   Procedure(s) performed (including Critical Care):  Procedures   ____________________________________________   INITIAL IMPRESSION / MDM / ASSESSMENT AND PLAN / ED COURSE  As part of my medical decision making, I reviewed the following data within the electronic MEDICAL RECORD NUMBER Nursing notes reviewed and incorporated, Labs reviewed , EKG interpreted , Old chart reviewed, Radiograph reviewed  and Notes from prior ED visits  HEAR Score: 3    Differential diagnosis includes, but is not limited to, anxiety/panic attack, musculoskeletal pain, PE, ACS.  The patient is PERC negative and has a heart score of 3.  I think that anxiety played a significant factor particularly after she took  her friend Suboxone.  I counseled her to never take medications willing to another person and follow-up as an outpatient.  There is no indication for any acute emergent medical condition.  Lab results are generally reassuring with a slightly low potassium but this is unlikely to be contributory.  She has had 2- troponins and an essentially normal CBC.  I gave my usual customary return precautions and she understands and agrees with the plan.     ____________________________________________  FINAL CLINICAL IMPRESSION(S) / ED DIAGNOSES  Final diagnoses:  Atypical chest pain  Anxiety     MEDICATIONS GIVEN DURING THIS VISIT:  Medications - No data to display   ED Discharge Orders    None       Note:  This document was prepared using Dragon voice recognition software and may include unintentional dictation errors.   Loleta Rose, MD 04/23/18 [phone]

## 2018-10-06 ENCOUNTER — Other Ambulatory Visit: Payer: Self-pay

## 2018-10-06 ENCOUNTER — Encounter: Payer: Self-pay | Admitting: Emergency Medicine

## 2018-10-06 ENCOUNTER — Emergency Department
Admission: EM | Admit: 2018-10-06 | Discharge: 2018-10-06 | Disposition: A | Discharge status: Home / Self Care | Payer: Medicaid Other | Attending: Student in an Organized Health Care Education/Training Program | Admitting: Student in an Organized Health Care Education/Training Program

## 2018-10-06 DIAGNOSIS — S0501XA Injury of conjunctiva and corneal abrasion without foreign body, right eye, initial encounter: Secondary | ICD-10-CM | POA: Diagnosis not present

## 2018-10-06 DIAGNOSIS — Y929 Unspecified place or not applicable: Secondary | ICD-10-CM | POA: Insufficient documentation

## 2018-10-06 DIAGNOSIS — S0591XA Unspecified injury of right eye and orbit, initial encounter: Secondary | ICD-10-CM | POA: Diagnosis present

## 2018-10-06 DIAGNOSIS — Z79899 Other long term (current) drug therapy: Secondary | ICD-10-CM | POA: Diagnosis not present

## 2018-10-06 DIAGNOSIS — Z720 Tobacco use: Secondary | ICD-10-CM | POA: Insufficient documentation

## 2018-10-06 DIAGNOSIS — Y939 Activity, unspecified: Secondary | ICD-10-CM | POA: Diagnosis not present

## 2018-10-06 DIAGNOSIS — I10 Essential (primary) hypertension: Secondary | ICD-10-CM | POA: Diagnosis not present

## 2018-10-06 DIAGNOSIS — W500XXA Accidental hit or strike by another person, initial encounter: Secondary | ICD-10-CM | POA: Insufficient documentation

## 2018-10-06 DIAGNOSIS — Y999 Unspecified external cause status: Secondary | ICD-10-CM | POA: Diagnosis not present

## 2018-10-06 MED ORDER — FLUORESCEIN SODIUM 1 MG OP STRP
ORAL_STRIP | OPHTHALMIC | Status: AC
  Administered 2018-10-06: 1 via OPHTHALMIC
  Filled 2018-10-06: qty 1

## 2018-10-06 MED ORDER — TETRACAINE HCL 0.5 % OP SOLN
2.0000 [drp] | Freq: Once | OPHTHALMIC | Status: AC
  Administered 2018-10-06: 2 [drp] via OPHTHALMIC
  Filled 2018-10-06: qty 4

## 2018-10-06 MED ORDER — FLUORESCEIN SODIUM 1 MG OP STRP
1.0000 | ORAL_STRIP | Freq: Once | OPHTHALMIC | Status: AC
  Administered 2018-10-06: 1 via OPHTHALMIC

## 2018-10-06 MED ORDER — SULFACETAMIDE SODIUM 10 % OP SOLN: 2.0000 [drp] | Freq: Four times a day (QID) | OPHTHALMIC | 0 refills | Status: AC

## 2018-10-06 MED ORDER — EYE WASH OPHTH SOLN
1.0000 [drp] | OPHTHALMIC | Status: DC | PRN
  Administered 2018-10-06: 1 [drp] via OPHTHALMIC
  Filled 2018-10-06: qty 118

## 2018-10-06 NOTE — ED Triage Notes (Signed)
Patient presents to the ED with redness to her right eye since yesterday evening.  Patient is in no obvious distress at this time.  Will continue to monitoring.

## 2018-10-06 NOTE — ED Provider Notes (Signed)
[facility] Emergency Department Provider Note   ____________________________________________   First MD Initiated Contact with Patient 10/06/18 1115     (approximate)  I have reviewed the triage vital signs and the nursing notes.   HISTORY  Chief Complaint Eye Problem    HPI Jeanette Alexander is a 28 y.o. female patient present for right eye pain for 2 days.  Patient was gently struck in the eye by long fingernail from his wife.  Denies vision disturbance.  Rates pain as 8/10.  Describes pain as "aching".  No palliative measures for complaint.     Past Medical History:  Diagnosis Date  . Acquired acanthosis nigricans   . Allergic rhinitis, cause unspecified   . Anemia, unspecified   . Bipolar disorder, unspecified ([place])   . Cough variant asthma   . Dysthymic disorder   . Essential hypertension, benign   . Headache(784.0)   . Hypertension   . Major depressive disorder, recurrent episode, moderate ([place])   . Migraine, unspecified, without mention of intractable migraine without mention of status migrainosus   . Obesity, unspecified   . Other nonspecific abnormal finding of lung field   . Panic disorder     Patient Active Problem List   Diagnosis Date Noted  . H/O: HTN (hypertension) 09/03/2014  . Depression, major, recurrent, moderate ([place]) 09/03/2014  . H/O: obesity 09/03/2014  . Episodic paroxysmal anxiety disorder 09/03/2014  . SOB (shortness of breath) 10/16/2013  . Heart palpitations 09/27/2013    Past Surgical History:  Procedure Laterality Date  . CESAREAN SECTION    . TONSILLECTOMY      Prior to Admission medications   Medication Sig Start Date End Date Taking? Authorizing Provider  hydrochlorothiazide (HYDRODIURIL) 12.5 MG tablet TAKE 1 TABLET BY MOUTH IN THE MORNING FOR BLOOD PRESSURE 09/11/14   Steele Sizer, MD  metoprolol succinate (TOPROL-XL) 25 MG 24 hr tablet TAKE 1 TABLET BY MOUTH EVERY DAY FOR BLOOD PRESSURE AND  PALPITATIONS 09/11/14   Steele Sizer, MD  sulfacetamide (BLEPH-10) 10 % ophthalmic solution Place 2 drops into both eyes 4 (four) times daily for 10 days. 10/06/18 10/16/18  Sable Feil, PA-C    Allergies Depakote [divalproex sodium], Tegretol [carbamazepine], and Cepacol [cetylpyridinium]  Family History  Problem Relation Age of Onset  . Hypertension Mother   . Leukemia Mother   . Diabetes Mother   . Heart murmur Mother   . Heart attack Mother   . Heart failure Mother     Social History Social History   Tobacco Use  . Smoking status: Current Every Day Smoker    Types: Cigars  . Smokeless tobacco: Never Used  Substance Use Topics  . Alcohol use: Yes    Comment: occassionally  . Drug use: Yes    Types: Marijuana, Cocaine    Review of Systems Constitutional: No fever/chills Eyes: No visual changes. ENT: No sore throat. Cardiovascular: Denies chest pain. Respiratory: Denies shortness of breath. Gastrointestinal: No abdominal pain.  No nausea, no vomiting.  No diarrhea.  No constipation. Genitourinary: Negative for dysuria. Musculoskeletal: Negative for back pain. Skin: Negative for rash. Neurological: Negative for headaches, focal weakness or numbness. Psychiatric: Bipolar  Endocrine:  Hypertension Hematological/Lymphatic:  Allergic/Immunilogical: See medication list. ____________________________________________   PHYSICAL EXAM:  VITAL SIGNS: ED Triage Vitals  Enc Vitals Group     BP 10/06/18 1110 (!) 148/89     Pulse Rate 10/06/18 1110 71     Resp 10/06/18 1110 16  Temp 10/06/18 1110 98.8 F (37.1 C)     Temp Source 10/06/18 1110 Oral     SpO2 10/06/18 1110 98 %     Weight --      Height --      Head Circumference --      Peak Flow --      Pain Score 10/06/18 1111 8     Pain Loc --      Pain Edu? --      Excl. in GC? --     Constitutional: Alert and oriented. Well appearing and in no acute distress. Eyes: Right conjunctiva is erythematous  are normal. PERRL. EOMI. Cardiovascular: Normal rate, regular rhythm. Grossly normal heart sounds.  Good peripheral circulation. Respiratory: Normal respiratory effort.  No retractions. Lungs CTAB. Neurologic:  Normal speech and language. No gross focal neurologic deficits are appreciated. No gait instability. Skin:  Skin is warm, dry and intact. No rash noted. Psychiatric: Mood and affect are normal. Speech and behavior are normal.  ____________________________________________   LABS (all labs ordered are listed, but only abnormal results are displayed)  Labs Reviewed - No data to display ____________________________________________  EKG   ____________________________________________  RADIOLOGY  ED MD interpretation:    Official radiology report(s): No results found.  ____________________________________________   PROCEDURES  Procedure(s) performed (including Critical Care):  Procedures   ____________________________________________   INITIAL IMPRESSION / ASSESSMENT AND PLAN / ED COURSE  As part of my medical decision making, I reviewed the following data within the electronic MEDICAL RECORD NUMBER         Jeanette Alexander was evaluated in Emergency Department on 10/06/2018 for the symptoms described in the history of present illness. She was evaluated in the context of the global COVID-19 pandemic, which necessitated consideration that the patient might be at risk for infection with the SARS-CoV-2 virus that causes COVID-19. Institutional protocols and algorithms that pertain to the evaluation of patients at risk for COVID-19 are in a state of rapid change based on information released by regulatory bodies including the CDC and federal and state organizations. These policies and algorithms were followed during the patient's care in the ED.   Patient presents with right eye pain.  His exam reveals a corneal abrasion the right eye.  Patient given discharge care instruction  advised use antibiotic eyedrops as directed.  Patient advised to follow ophthalmology if no improvement in 3 to 5 days.  Return to ED if condition worsens.      ____________________________________________   FINAL CLINICAL IMPRESSION(S) / ED DIAGNOSES  Final diagnoses:  Abrasion of right cornea, initial encounter     ED Discharge Orders         Ordered    sulfacetamide (BLEPH-10) 10 % ophthalmic solution  4 times daily     10/06/18 1205           Note:  This document was prepared using Dragon voice recognition software and may include unintentional dictation errors.    Joni ReiningSmith,  K, PA-C 10/06/18 1213    Sharman CheekStafford, Phillip, MD 10/16/18 1538

## 2020-06-17 ENCOUNTER — Other Ambulatory Visit: Payer: Self-pay

## 2020-06-17 ENCOUNTER — Emergency Department
Admission: EM | Admit: 2020-06-17 | Discharge: 2020-06-17 | Disposition: A | Discharge status: Home / Self Care | Payer: Medicaid Other | Attending: Emergency Medicine | Admitting: Emergency Medicine

## 2020-06-17 ENCOUNTER — Emergency Department: Payer: Medicaid Other

## 2020-06-17 DIAGNOSIS — F17290 Nicotine dependence, other tobacco product, uncomplicated: Secondary | ICD-10-CM | POA: Diagnosis not present

## 2020-06-17 DIAGNOSIS — I10 Essential (primary) hypertension: Secondary | ICD-10-CM | POA: Diagnosis not present

## 2020-06-17 DIAGNOSIS — Z23 Encounter for immunization: Secondary | ICD-10-CM | POA: Insufficient documentation

## 2020-06-17 DIAGNOSIS — S61209A Unspecified open wound of unspecified finger without damage to nail, initial encounter: Secondary | ICD-10-CM

## 2020-06-17 DIAGNOSIS — Y990 Civilian activity done for income or pay: Secondary | ICD-10-CM | POA: Diagnosis not present

## 2020-06-17 DIAGNOSIS — Z79899 Other long term (current) drug therapy: Secondary | ICD-10-CM | POA: Insufficient documentation

## 2020-06-17 DIAGNOSIS — W274XXA Contact with kitchen utensil, initial encounter: Secondary | ICD-10-CM | POA: Diagnosis not present

## 2020-06-17 DIAGNOSIS — S61314A Laceration without foreign body of right ring finger with damage to nail, initial encounter: Secondary | ICD-10-CM | POA: Insufficient documentation

## 2020-06-17 DIAGNOSIS — S6991XA Unspecified injury of right wrist, hand and finger(s), initial encounter: Secondary | ICD-10-CM | POA: Diagnosis present

## 2020-06-17 MED ORDER — HYDROCODONE-ACETAMINOPHEN 5-325 MG PO TABS
1.0000 | ORAL_TABLET | ORAL | Status: AC
  Administered 2020-06-17: 1 via ORAL
  Filled 2020-06-17: qty 1

## 2020-06-17 MED ORDER — FLUCONAZOLE 100 MG PO TABS: 150.0000 mg | ORAL_TABLET | Freq: Once | ORAL | 0 refills | Status: AC

## 2020-06-17 MED ORDER — LIDOCAINE HCL (PF) 1 % IJ SOLN
5.0000 mL | Freq: Once | INTRAMUSCULAR | Status: AC
  Administered 2020-06-17: 5 mL
  Filled 2020-06-17: qty 5

## 2020-06-17 MED ORDER — TETANUS-DIPHTH-ACELL PERTUSSIS 5-2.5-18.5 LF-MCG/0.5 IM SUSY
0.5000 mL | PREFILLED_SYRINGE | Freq: Once | INTRAMUSCULAR | Status: AC
  Administered 2020-06-17: 0.5 mL via INTRAMUSCULAR
  Filled 2020-06-17: qty 0.5

## 2020-06-17 MED ORDER — CEPHALEXIN 500 MG PO CAPS: 500.0000 mg | ORAL_CAPSULE | Freq: Four times a day (QID) | ORAL | 0 refills | Status: AC

## 2020-06-17 MED ORDER — HYDROCODONE-ACETAMINOPHEN 5-325 MG PO TABS: 1.0000 | ORAL_TABLET | ORAL | 0 refills | Status: DC | PRN

## 2020-06-17 NOTE — Discharge Instructions (Addendum)
Please keep dressing clean and dry until you follow-up with orthopedics.  Please follow-up with orthopedics and 3 to 5 days for recheck.  Take antibiotics as prescribed.  If your dressing becomes wet or saturated.  Please reinforce with dry sterile gauze.

## 2020-06-17 NOTE — ED Provider Notes (Signed)
[facility] EMERGENCY DEPARTMENT Provider Note   CSN: [id number] Arrival date & time: 06/17/20  1525     History Chief Complaint  Patient presents with  . Laceration    Jeanette Alexander is a 30 y.o. female presents to the emergency department for evaluation of a laceration to the right ring finger.  Patient states she was at work and cutting a [employer], operational with a meat cutter and cut the tip of her right ring finger off.  Patient's pain is moderate to severe.  Tetanus status unknown.  She denies any other injury to her body.  HPI     Past Medical History:  Diagnosis Date  . Acquired acanthosis nigricans   . Allergic rhinitis, cause unspecified   . Anemia, unspecified   . Bipolar disorder, unspecified (HCC)   . Cough variant asthma   . Dysthymic disorder   . Essential hypertension, benign   . Headache(784.0)   . Hypertension   . Major depressive disorder, recurrent episode, moderate (HCC)   . Migraine, unspecified, without mention of intractable migraine without mention of status migrainosus   . Obesity, unspecified   . Other nonspecific abnormal finding of lung field   . Panic disorder     Patient Active Problem List   Diagnosis Date Noted  . H/O: HTN (hypertension) 09/03/2014  . Depression, major, recurrent, moderate (HCC) 09/03/2014  . H/O: obesity 09/03/2014  . Episodic paroxysmal anxiety disorder 09/03/2014  . SOB (shortness of breath) 10/16/2013  . Heart palpitations 09/27/2013    Past Surgical History:  Procedure Laterality Date  . CESAREAN SECTION    . TONSILLECTOMY       OB History   No obstetric history on file.     Family History  Problem Relation Age of Onset  . Hypertension Mother   . Leukemia Mother   . Diabetes Mother   . Heart murmur Mother   . Heart attack Mother   . Heart failure Mother     Social History   Tobacco Use  . Smoking status: Current Every Day Smoker    Types: Cigars  . Smokeless tobacco: Never Used   Vaping Use  . Vaping Use: Never used  Substance Use Topics  . Alcohol use: Yes    Comment: occassionally  . Drug use: Yes    Types: Marijuana, Cocaine    Home Medications Prior to Admission medications   Medication Sig Start Date End Date Taking? Authorizing Provider  hydrochlorothiazide (HYDRODIURIL) 12.5 MG tablet TAKE 1 TABLET BY MOUTH IN THE MORNING FOR BLOOD PRESSURE 09/11/14   Alba Cory, MD  metoprolol succinate (TOPROL-XL) 25 MG 24 hr tablet TAKE 1 TABLET BY MOUTH EVERY DAY FOR BLOOD PRESSURE AND PALPITATIONS 09/11/14   Alba Cory, MD    Allergies    Depakote [divalproex sodium], Tegretol [carbamazepine], and Cepacol [cetylpyridinium]  Review of Systems   Review of Systems  Constitutional: Negative for fever.  Musculoskeletal: Positive for arthralgias.  Skin: Positive for wound. Negative for color change, pallor and rash.  Neurological: Negative for numbness.    Physical Exam Updated Vital Signs BP (!) 154/100   Pulse 68   Temp 98.2 F (36.8 C)   Resp 18   SpO2 100%   Physical Exam Constitutional:      Appearance: She is well-developed.  HENT:     Head: Normocephalic and atraumatic.  Eyes:     Conjunctiva/sclera: Conjunctivae normal.  Cardiovascular:     Rate and Rhythm: Normal rate.  Pulmonary:  Effort: Pulmonary effort is normal. No respiratory distress.  Musculoskeletal:     Cervical back: Normal range of motion.     Comments: Right ring finger with avulsion of the tip of the digit and distal portion of the nail.  There does not appear to be any exposed bone.  No loss of active flexion or extension of the DIP joint.  Skin:    General: Skin is warm.     Findings: No rash.  Neurological:     Mental Status: She is alert and oriented to person, place, and time.  Psychiatric:        Behavior: Behavior normal.        Thought Content: Thought content normal.     ED Results / Procedures / Treatments   Labs (all labs ordered are listed,  but only abnormal results are displayed) Labs Reviewed - No data to display  EKG None  Radiology No results found.  Procedures .Marland KitchenLaceration Repair  Date/Time: 06/17/2020 5:26 PM Performed by: Evon Slack, PA-C Authorized by: Evon Slack, PA-C   Consent:    Consent obtained:  Verbal   Consent given by:  Patient   Risks discussed:  Infection, need for additional repair and poor cosmetic result Anesthesia:    Anesthesia method:  Nerve block   Block needle gauge:  25 G   Block anesthetic:  Lidocaine 1% w/o epi   Block technique:  Right ring finger digital block   Block injection procedure:  Anatomic landmarks palpated   Block outcome:  Anesthesia achieved Laceration details:    Location:  Finger   Finger location:  R ring finger   Length (cm):  2   Depth (mm):  3 Pre-procedure details:    Preparation:  Patient was prepped and draped in usual sterile fashion Exploration:    Imaging obtained: x-ray     Wound exploration: wound explored through full range of motion     Contaminated: no   Treatment:    Area cleansed with:  Povidone-iodine and saline   Amount of cleaning:  Standard   Irrigation volume:  Soak   Debridement:  Minimal Skin repair:    Repair method:  Sutures   Suture size:  4-0   Suture material:  Nylon   Number of sutures:  4 Approximation:    Approximation:  Close Repair type:    Repair type:  Intermediate Comments:     Patient with a avulsion to the tip of the digit.  Partial nail avulsion.  Exposed bone was present and after digital block achieved a bone rondure was used to remove visible exposed bone.  Once the bone was removed sutures were used to create a flap from the volar pulp space up over the distal phalanx.  Sutures were anchored into the nail and good closure was achieved.  Bone was completely covered.  Steri-Strips were then used along with Vaseline gauze and dry sterile [place]     Medications Ordered in ED Medications  Tdap  (BOOSTRIX) injection 0.5 mL (has no administration in time range)  HYDROcodone-acetaminophen (NORCO/VICODIN) 5-325 MG per tablet 1 tablet (has no administration in time range)  lidocaine (PF) (XYLOCAINE) 1 % injection 5 mL (has no administration in time range)    ED Course  I have reviewed the triage vital signs and the nursing notes.  Pertinent labs & imaging results that were available during my care of the patient were reviewed by me and considered in my medical decision making (see chart  for details).    MDM Rules/Calculators/A&P                          30 year old female with avulsion of the right ring finger soft tissue and tuft.  Laceration was cleansed, bone rondure used to remove the distal tuft and exposed bone was completely covered with soft tissue using sutures to anchor the soft tissues over the exposed bone.  Patient tolerated procedure well.  She is educated on wound care.  She is placed on prophylactic antibiotics tetanus updated. Final Clinical Impression(s) / ED Diagnoses Final diagnoses:  Laceration of right ring finger without foreign body with damage to nail, initial encounter  Avulsion of finger tip, initial encounter    Rx / DC Orders ED Discharge Orders    None       Ronnette Juniper 06/17/20 1730    Delton Prairie, MD 06/17/20 1810

## 2020-06-17 NOTE — ED Triage Notes (Signed)
Pt comes with c/o finger lac to right ring finger. Pt states she cut it with a saw. Pt has bandage in place.

## 2020-11-17 ENCOUNTER — Emergency Department
Admission: EM | Admit: 2020-11-17 | Discharge: 2020-11-17 | Disposition: A | Discharge status: Home / Self Care | Payer: Medicaid Other | Attending: Emergency Medicine | Admitting: Emergency Medicine

## 2020-11-17 ENCOUNTER — Encounter: Payer: Self-pay | Admitting: Emergency Medicine

## 2020-11-17 ENCOUNTER — Other Ambulatory Visit: Payer: Self-pay

## 2020-11-17 DIAGNOSIS — I10 Essential (primary) hypertension: Secondary | ICD-10-CM | POA: Insufficient documentation

## 2020-11-17 DIAGNOSIS — Z20822 Contact with and (suspected) exposure to covid-19: Secondary | ICD-10-CM | POA: Insufficient documentation

## 2020-11-17 DIAGNOSIS — R0989 Other specified symptoms and signs involving the circulatory and respiratory systems: Secondary | ICD-10-CM | POA: Diagnosis not present

## 2020-11-17 DIAGNOSIS — F17290 Nicotine dependence, other tobacco product, uncomplicated: Secondary | ICD-10-CM | POA: Diagnosis not present

## 2020-11-17 DIAGNOSIS — Z79899 Other long term (current) drug therapy: Secondary | ICD-10-CM | POA: Diagnosis not present

## 2020-11-17 LAB — SARS CORONAVIRUS 2 (TAT 6-24 HRS): SARS Coronavirus 2: NEGATIVE

## 2020-11-17 NOTE — ED Provider Notes (Signed)
[facility] Emergency Department Provider Note   ____________________________________________    I have reviewed the triage vital signs and the nursing notes.   HISTORY  Chief Complaint Nasal Congestion     HPI Jeanette Alexander is a 30 y.o. female with history as noted below who reports her son recently tested positive for COVID-19.  She reports mild scratchy throat and some sinus pressure.  No shortness of breath, no cough.  No fevers reported.  He is here for COVID test  Past Medical History:  Diagnosis Date   Acquired acanthosis nigricans    Allergic rhinitis, cause unspecified    Anemia, unspecified    Bipolar disorder, unspecified (HCC)    Cough variant asthma    Dysthymic disorder    Essential hypertension, benign    Headache(784.0)    Hypertension    Major depressive disorder, recurrent episode, moderate (HCC)    Migraine, unspecified, without mention of intractable migraine without mention of status migrainosus    Obesity, unspecified    Other nonspecific abnormal finding of lung field    Panic disorder     Patient Active Problem List   Diagnosis Date Noted   H/O: HTN (hypertension) 09/03/2014   Depression, major, recurrent, moderate (HCC) 09/03/2014   H/O: obesity 09/03/2014   Episodic paroxysmal anxiety disorder 09/03/2014   SOB (shortness of breath) 10/16/2013   Heart palpitations 09/27/2013    Past Surgical History:  Procedure Laterality Date   CESAREAN SECTION     TONSILLECTOMY      Prior to Admission medications   Medication Sig Start Date End Date Taking? Authorizing Provider  hydrochlorothiazide (HYDRODIURIL) 12.5 MG tablet TAKE 1 TABLET BY MOUTH IN THE MORNING FOR BLOOD PRESSURE 09/11/14   Alba Cory, MD  HYDROcodone-acetaminophen (NORCO) 5-325 MG tablet Take 1 tablet by mouth every 4 (four) hours as needed for moderate pain. 06/17/20   Evon Slack, PA-C  metoprolol succinate (TOPROL-XL) 25 MG 24 hr tablet  TAKE 1 TABLET BY MOUTH EVERY DAY FOR BLOOD PRESSURE AND PALPITATIONS 09/11/14   Alba Cory, MD     Allergies Depakote [divalproex sodium], Tegretol [carbamazepine], and Cepacol [cetylpyridinium]  Family History  Problem Relation Age of Onset   Hypertension Mother    Leukemia Mother    Diabetes Mother    Heart murmur Mother    Heart attack Mother    Heart failure Mother     Social History Social History   Tobacco Use   Smoking status: Every Day    Types: Cigars   Smokeless tobacco: Never  Vaping Use   Vaping Use: Never used  Substance Use Topics   Alcohol use: Yes    Comment: occassionally   Drug use: Yes    Types: Marijuana, Cocaine    Review of Systems  Constitutional: No fever/chills  ENT: Mild sore throat, mild sinus pressure   Gastrointestinal: No abdominal pain.  No nausea, no vomiting.   Genitourinary: Negative for dysuria. Musculoskeletal: Negative for back pain. Skin: Negative for rash. Neurological: Negative for headaches     ____________________________________________   PHYSICAL EXAM:  VITAL SIGNS: ED Triage Vitals  Enc Vitals Group     BP 11/17/20 1246 (!) 153/90     Pulse Rate 11/17/20 1246 66     Resp 11/17/20 1246 16     Temp 11/17/20 1246 98.9 F (37.2 C)     Temp Source 11/17/20 1246 Oral     SpO2 11/17/20 1246 98 %  Weight 11/17/20 1235 116.6 kg (257 lb)     Height 11/17/20 1235 1.575 m (5\' 2" )     Head Circumference --      Peak Flow --      Pain Score 11/17/20 1235 0     Pain Loc --      Pain Edu? --      Excl. in GC? --      Constitutional: Alert and oriented. No acute distress. Pleasant and interactive Eyes: Conjunctivae are normal.  Head: Atraumatic. Nose: No congestion/rhinnorhea. Mouth/Throat: Mucous membranes are moist.   Cardiovascular: Normal rate, regular rhythm.  Respiratory: Normal respiratory effort.  No retractions. Genitourinary: deferred Musculoskeletal: No lower extremity tenderness nor edema.    Neurologic:  Normal speech and language. No gross focal neurologic deficits are appreciated.   Skin:  Skin is warm, dry and intact. No rash noted.   ____________________________________________   LABS (all labs ordered are listed, but only abnormal results are displayed)  Labs Reviewed  SARS CORONAVIRUS 2 (TAT 6-24 HRS)   ____________________________________________  EKG   ____________________________________________  RADIOLOGY   ____________________________________________   PROCEDURES  Procedure(s) performed: No  Procedures   Critical Care performed: No ____________________________________________   INITIAL IMPRESSION / ASSESSMENT AND PLAN / ED COURSE  Pertinent labs & imaging results that were available during my care of the patient were reviewed by me and considered in my medical decision making (see chart for details).   COVID test sent   ____________________________________________   FINAL CLINICAL IMPRESSION(S) / ED DIAGNOSES  Final diagnoses:  Suspected COVID-19 virus infection      NEW MEDICATIONS STARTED DURING THIS VISIT:  Discharge Medication List as of 11/17/2020 12:42 PM       Note:  This document was prepared using Dragon voice recognition software and may include unintentional dictation errors.    11/19/2020, MD 11/17/20 [phone]

## 2020-11-17 NOTE — ED Triage Notes (Signed)
C/O nasal congestion and no fever since Friday evening.

## 2020-12-10 ENCOUNTER — Emergency Department
Admission: EM | Admit: 2020-12-10 | Discharge: 2020-12-10 | Disposition: A | Discharge status: Home / Self Care | Payer: Medicaid Other | Attending: Emergency Medicine | Admitting: Emergency Medicine

## 2020-12-10 ENCOUNTER — Emergency Department: Payer: Medicaid Other

## 2020-12-10 ENCOUNTER — Other Ambulatory Visit: Payer: Self-pay

## 2020-12-10 DIAGNOSIS — J3489 Other specified disorders of nose and nasal sinuses: Secondary | ICD-10-CM | POA: Insufficient documentation

## 2020-12-10 DIAGNOSIS — Z79899 Other long term (current) drug therapy: Secondary | ICD-10-CM | POA: Insufficient documentation

## 2020-12-10 DIAGNOSIS — F17290 Nicotine dependence, other tobacco product, uncomplicated: Secondary | ICD-10-CM | POA: Diagnosis not present

## 2020-12-10 DIAGNOSIS — R509 Fever, unspecified: Secondary | ICD-10-CM | POA: Diagnosis present

## 2020-12-10 DIAGNOSIS — J069 Acute upper respiratory infection, unspecified: Secondary | ICD-10-CM | POA: Diagnosis not present

## 2020-12-10 DIAGNOSIS — I10 Essential (primary) hypertension: Secondary | ICD-10-CM | POA: Insufficient documentation

## 2020-12-10 DIAGNOSIS — U071 COVID-19: Secondary | ICD-10-CM | POA: Insufficient documentation

## 2020-12-10 LAB — RESP PANEL BY RT-PCR (FLU A&B, COVID) ARPGX2
Influenza A by PCR: NEGATIVE
Influenza B by PCR: NEGATIVE
SARS Coronavirus 2 by RT PCR: POSITIVE — AB

## 2020-12-10 NOTE — Discharge Instructions (Signed)
Please take Tylenol and ibuprofen/Advil for your pain.  It is safe to take them together, or to alternate them every few hours.  Take up to 1000mg  of Tylenol at a time, up to 4 times per day.  Do not take more than 4000 mg of Tylenol in 24 hours.  For ibuprofen, take 400-600 mg, 4-5 times per day.  Check your MyChart results for your COVID swab result.  This should result later this evening.  Return to the ED with any further worsening symptoms.

## 2020-12-10 NOTE — ED Notes (Signed)
View triage note for CC. Pt endorses congestion and cough for 3 days - denies SOB or CP.

## 2020-12-10 NOTE — ED Triage Notes (Signed)
Congestion and fever for 3 days with green mucus

## 2020-12-10 NOTE — ED Provider Notes (Signed)
[facility] Emergency Department Provider Note ____________________________________________   Event Date/Time   First MD Initiated Contact with Patient 12/10/20 1626     (approximate)  I have reviewed the triage vital signs and the nursing notes.  HISTORY  Chief Complaint Fever   HPI Jeanette Alexander is a 30 y.o. femalewho presents to the ED for evaluation of congestion and fever.   Chart review indicates morbidly obese patient. No immunosuppression.   Patient presents to the ED, accompanied by her wife, for evaluation of 3 days of respiratory congestion and subjective fevers.  She reports clear rhinorrhea, upper respiratory congestion and a sensation of itchiness in her throat.  Reports cough with minimal sputum production.  Denies chest pain or shortness of breath.  Denies syncope, neck stiffness or trauma.  Denies abdominal pain, emesis or diarrhea.  Denies dysuria or urinary frequency.  Past Medical History:  Diagnosis Date   Acquired acanthosis nigricans    Allergic rhinitis, cause unspecified    Anemia, unspecified    Bipolar disorder, unspecified (HCC)    Cough variant asthma    Dysthymic disorder    Essential hypertension, benign    Headache(784.0)    Hypertension    Major depressive disorder, recurrent episode, moderate (HCC)    Migraine, unspecified, without mention of intractable migraine without mention of status migrainosus    Obesity, unspecified    Other nonspecific abnormal finding of lung field    Panic disorder     Patient Active Problem List   Diagnosis Date Noted   H/O: HTN (hypertension) 09/03/2014   Depression, major, recurrent, moderate (HCC) 09/03/2014   H/O: obesity 09/03/2014   Episodic paroxysmal anxiety disorder 09/03/2014   SOB (shortness of breath) 10/16/2013   Heart palpitations 09/27/2013    Past Surgical History:  Procedure Laterality Date   CESAREAN SECTION     TONSILLECTOMY      Prior to Admission  medications   Medication Sig Start Date End Date Taking? Authorizing Provider  hydrochlorothiazide (HYDRODIURIL) 12.5 MG tablet TAKE 1 TABLET BY MOUTH IN THE MORNING FOR BLOOD PRESSURE 09/11/14   Alba Cory, MD  HYDROcodone-acetaminophen (NORCO) 5-325 MG tablet Take 1 tablet by mouth every 4 (four) hours as needed for moderate pain. 06/17/20   Evon Slack, PA-C  metoprolol succinate (TOPROL-XL) 25 MG 24 hr tablet TAKE 1 TABLET BY MOUTH EVERY DAY FOR BLOOD PRESSURE AND PALPITATIONS 09/11/14   Alba Cory, MD    Allergies Depakote [divalproex sodium], Tegretol [carbamazepine], and Cepacol [cetylpyridinium]  Family History  Problem Relation Age of Onset   Hypertension Mother    Leukemia Mother    Diabetes Mother    Heart murmur Mother    Heart attack Mother    Heart failure Mother     Social History Social History   Tobacco Use   Smoking status: Every Day    Types: Cigars   Smokeless tobacco: Never  Vaping Use   Vaping Use: Never used  Substance Use Topics   Alcohol use: Yes    Comment: occassionally   Drug use: Yes    Types: Marijuana, Cocaine    Review of Systems  Constitutional: Positive for subjective fevers. Eyes: No visual changes. ENT: No sore throat.  Positive for respiratory congestion and rhinorrhea Cardiovascular: Denies chest pain. Respiratory: Denies shortness of breath. Gastrointestinal: No abdominal pain.  No nausea, no vomiting.  No diarrhea.  No constipation. Genitourinary: Negative for dysuria. Musculoskeletal: Negative for back pain. Skin: Negative for rash. Neurological:  Negative for headaches, focal weakness or numbness.  ____________________________________________   PHYSICAL EXAM:  VITAL SIGNS: Vitals:   12/10/20 1424  BP: 128/73  Pulse: 72  Resp: 18  Temp: 98.3 F (36.8 C)  SpO2: 100%    Constitutional: Alert and oriented. Well appearing and in no acute distress.  Obese. Eyes: Conjunctivae are normal. PERRL. EOMI. Head:  Atraumatic. Nose: Clear congestion/rhinnorhea. Mouth/Throat: Mucous membranes are moist.  Oropharynx non-erythematous. Neck: No stridor. No cervical spine tenderness to palpation. Cardiovascular: Normal rate, regular rhythm. Grossly normal heart sounds.  Good peripheral circulation. Respiratory: Normal respiratory effort.  No retractions. Lungs CTAB. Gastrointestinal: Soft , nondistended, nontender to palpation. No CVA tenderness. Musculoskeletal: No lower extremity tenderness nor edema.  No joint effusions. No signs of acute trauma. Neurologic:  Normal speech and language. No gross focal neurologic deficits are appreciated. No gait instability noted. Skin:  Skin is warm, dry and intact. No rash noted. Psychiatric: Mood and affect are normal. Speech and behavior are normal. ____________________________________________   LABS (all labs ordered are listed, but only abnormal results are displayed)  Labs Reviewed  RESP PANEL BY RT-PCR (FLU A&B, COVID) [id number]   ____________________________________________  12 Lead EKG   ____________________________________________  RADIOLOGY  ED MD interpretation:  CXR reviewed by me without evidence of acute cardiopulmonary pathology.  Official radiology report(s): DG Chest 2 View  Result Date: 12/10/2020 CLINICAL DATA:  Fever, cough EXAM: CHEST - 2 VIEW COMPARISON:  04/23/2018 FINDINGS: The heart size and mediastinal contours are within normal limits. Both lungs are clear. The visualized skeletal structures are unremarkable. IMPRESSION: No active cardiopulmonary disease. Electronically Signed   By: Sharlet Salina M.D.   On: 12/10/2020 17:15    ____________________________________________   PROCEDURES and INTERVENTIONS  Procedure(s) performed (including Critical Care):  Procedures  Medications - No data to display  ____________________________________________   MDM / ED COURSE   30 year old woman presents to the ED with evidence of  viral URI and cough, without evidence of systemic illness, sepsis or CAP, amenable to outpatient management.  Reassuring CXR without infiltration.  She looks clinically well.  Does have a viral syndrome.  COVID/influenza swab is pending the time of discharge.  Return precautions were discussed.   Clinical Course as of 12/10/20 1722  Wed Dec 10, 2020  1721 Reassessed.  Patient reports feeling well.  We discussed reassuring CXR and had a check for her COVID results on her MyChart.  We discussed return precautions. [DS]    Clinical Course User Index [DS] Delton Prairie, MD    ____________________________________________   FINAL CLINICAL IMPRESSION(S) / ED DIAGNOSES  Final diagnoses:  Viral URI with cough     ED Discharge Orders     None        Jeanette Alexander   Note:  This document was prepared using Dragon voice recognition software and may include unintentional dictation errors.    Delton Prairie, MD 12/10/20 [phone]

## 2021-01-08 ENCOUNTER — Encounter: Payer: Self-pay | Admitting: Emergency Medicine

## 2021-01-08 ENCOUNTER — Other Ambulatory Visit: Payer: Self-pay

## 2021-01-08 ENCOUNTER — Emergency Department
Admission: EM | Admit: 2021-01-08 | Discharge: 2021-01-08 | Disposition: A | Discharge status: Home / Self Care | Payer: Medicaid Other | Attending: Emergency Medicine | Admitting: Emergency Medicine

## 2021-01-08 ENCOUNTER — Emergency Department: Payer: Medicaid Other

## 2021-01-08 DIAGNOSIS — R1013 Epigastric pain: Secondary | ICD-10-CM | POA: Insufficient documentation

## 2021-01-08 DIAGNOSIS — F17290 Nicotine dependence, other tobacco product, uncomplicated: Secondary | ICD-10-CM | POA: Diagnosis not present

## 2021-01-08 DIAGNOSIS — R197 Diarrhea, unspecified: Secondary | ICD-10-CM | POA: Insufficient documentation

## 2021-01-08 DIAGNOSIS — K920 Hematemesis: Secondary | ICD-10-CM | POA: Insufficient documentation

## 2021-01-08 DIAGNOSIS — I10 Essential (primary) hypertension: Secondary | ICD-10-CM | POA: Insufficient documentation

## 2021-01-08 DIAGNOSIS — R112 Nausea with vomiting, unspecified: Secondary | ICD-10-CM

## 2021-01-08 LAB — URINALYSIS, ROUTINE W REFLEX MICROSCOPIC
Bacteria, UA: NONE SEEN
Bilirubin Urine: NEGATIVE
Glucose, UA: NEGATIVE mg/dL
Ketones, ur: NEGATIVE mg/dL
Nitrite: NEGATIVE
Protein, ur: 100 mg/dL — AB
RBC / HPF: >50 RBC/hpf — ABNORMAL HIGH (ref 0–5)
Specific Gravity, Urine: 1.023 (ref 1.005–1.030)
WBC, UA: >50 WBC/hpf — ABNORMAL HIGH (ref 0–5)
pH: 5 (ref 5.0–8.0)

## 2021-01-08 LAB — COMPREHENSIVE METABOLIC PANEL
ALT: 16 U/L (ref 0–44)
AST: 21 U/L (ref 15–41)
Albumin: 4.3 g/dL (ref 3.5–5.0)
Alkaline Phosphatase: 46 U/L (ref 38–126)
Anion gap: 7 (ref 5–15)
BUN: 7 mg/dL (ref 6–20)
CO2: 23 mmol/L (ref 22–32)
Calcium: 9 mg/dL (ref 8.9–10.3)
Chloride: 106 mmol/L (ref 98–111)
Creatinine, Ser: 0.57 mg/dL (ref 0.44–1.00)
GFR, Estimated: >60 mL/min (ref >60)
Glucose, Bld: 99 mg/dL (ref 70–99)
Potassium: 3.7 mmol/L (ref 3.5–5.1)
Sodium: 136 mmol/L (ref 135–145)
Total Bilirubin: 0.9 mg/dL (ref 0.3–1.2)
Total Protein: 7.6 g/dL (ref 6.5–8.1)

## 2021-01-08 LAB — CBC
HCT: 31.9 % — ABNORMAL LOW (ref 36.0–46.0)
Hemoglobin: 10.3 g/dL — ABNORMAL LOW (ref 12.0–15.0)
MCH: 22.9 pg — ABNORMAL LOW (ref 26.0–34.0)
MCHC: 32.3 g/dL (ref 30.0–36.0)
MCV: 71 fL — ABNORMAL LOW (ref 80.0–100.0)
Platelets: 402 10*3/uL — ABNORMAL HIGH (ref 150–400)
RBC: 4.49 MIL/uL (ref 3.87–5.11)
RDW: 20.1 % — ABNORMAL HIGH (ref 11.5–15.5)
WBC: 6.6 10*3/uL (ref 4.0–10.5)
nRBC: 0 % (ref 0.0–0.2)

## 2021-01-08 LAB — PREGNANCY, URINE: Preg Test, Ur: NEGATIVE

## 2021-01-08 LAB — LIPASE, BLOOD: Lipase: 30 U/L (ref 11–51)

## 2021-01-08 MED ORDER — DIPHENHYDRAMINE HCL 50 MG/ML IJ SOLN: 12.5000 mg | Freq: Once | INTRAMUSCULAR | Status: DC

## 2021-01-08 MED ORDER — METOCLOPRAMIDE HCL 5 MG/ML IJ SOLN
10.0000 mg | Freq: Once | INTRAMUSCULAR | Status: AC
  Administered 2021-01-08: 10 mg via INTRAVENOUS
  Filled 2021-01-08: qty 2

## 2021-01-08 MED ORDER — SODIUM CHLORIDE 0.9 % IV BOLUS
1000.0000 mL | Freq: Once | INTRAVENOUS | Status: AC
  Administered 2021-01-08: 1000 mL via INTRAVENOUS

## 2021-01-08 NOTE — ED Triage Notes (Signed)
Patient to ED for N/V/D x2 days. Patient states that she can't keep anything down. Patient ambulatory to triage.

## 2021-01-08 NOTE — ED Notes (Signed)
2nd call. No answer. 

## 2021-01-08 NOTE — ED Provider Notes (Signed)
[facility] Emergency Department Provider Note  ____________________________________________   I have reviewed the triage vital signs and the nursing notes.   HISTORY  Chief Complaint Emesis   History limited by: Not Limited   HPI Jeanette Alexander is a 30 y.o. female who presents to the emergency department today because of concerns for nausea and vomiting.  Patient states the symptoms started 2 days ago.  They have been persistent since then.  She has not been able to tolerate any p.o.  She has had accompanying epigastric pain.  She states she has noticed just a little bit of blood in the vomit but thinks that it could be due to her vomiting a lot.  She did have some diarrhea although no significant diarrhea.  No black or bloody stool.  Patient denies any fevers.  Denies any known sick contacts.  States that she was told once in the past that she has gallstones.    Records reviewed. Per medical record review patient has a history of [place] in 2011 showed gallstones.  Past Medical History:  Diagnosis Date   Acquired acanthosis nigricans    Allergic rhinitis, cause unspecified    Anemia, unspecified    Bipolar disorder, unspecified (HCC)    Cough variant asthma    Dysthymic disorder    Essential hypertension, benign    Headache(784.0)    Hypertension    Major depressive disorder, recurrent episode, moderate (HCC)    Migraine, unspecified, without mention of intractable migraine without mention of status migrainosus    Obesity, unspecified    Other nonspecific abnormal finding of lung field    Panic disorder     Patient Active Problem List   Diagnosis Date Noted   H/O: HTN (hypertension) 09/03/2014   Depression, major, recurrent, moderate (HCC) 09/03/2014   H/O: obesity 09/03/2014   Episodic paroxysmal anxiety disorder 09/03/2014   SOB (shortness of breath) 10/16/2013   Heart palpitations 09/27/2013    Past Surgical History:  Procedure Laterality  Date   CESAREAN SECTION     TONSILLECTOMY      Prior to Admission medications   Medication Sig Start Date End Date Taking? Authorizing Provider  hydrochlorothiazide (HYDRODIURIL) 12.5 MG tablet TAKE 1 TABLET BY MOUTH IN THE MORNING FOR BLOOD PRESSURE 09/11/14   Alba Cory, MD  HYDROcodone-acetaminophen (NORCO) 5-325 MG tablet Take 1 tablet by mouth every 4 (four) hours as needed for moderate pain. 06/17/20   Evon Slack, PA-C  metoprolol succinate (TOPROL-XL) 25 MG 24 hr tablet TAKE 1 TABLET BY MOUTH EVERY DAY FOR BLOOD PRESSURE AND PALPITATIONS 09/11/14   Alba Cory, MD    Allergies Depakote [divalproex sodium], Tegretol [carbamazepine], and Cepacol [cetylpyridinium]  Family History  Problem Relation Age of Onset   Hypertension Mother    Leukemia Mother    Diabetes Mother    Heart murmur Mother    Heart attack Mother    Heart failure Mother     Social History Social History   Tobacco Use   Smoking status: Every Day    Types: Cigars   Smokeless tobacco: Never  Vaping Use   Vaping Use: Never used  Substance Use Topics   Alcohol use: Yes    Comment: occassionally   Drug use: Yes    Types: Marijuana, Cocaine    Review of Systems Constitutional: No fever/chills Eyes: No visual changes. ENT: No sore throat. Cardiovascular: Denies chest pain. Respiratory: Denies shortness of breath. Gastrointestinal: Positive for abdominal pain, nausea and vomiting.  Genitourinary: Negative for dysuria. Musculoskeletal: Negative for back pain. Skin: Negative for rash. Neurological: Negative for headaches, focal weakness or numbness.  ____________________________________________   PHYSICAL EXAM:  VITAL SIGNS: ED Triage Vitals  Enc Vitals Group     BP 01/08/21 1729 (!) 172/83     Pulse Rate 01/08/21 1729 (!) 55     Resp 01/08/21 1729 18     Temp 01/08/21 1729 98.4 F (36.9 C)     Temp Source 01/08/21 1729 Oral     SpO2 01/08/21 1729 100 %     Weight 01/08/21  1729 180 lb (81.6 kg)     Height 01/08/21 1729 5\' 5"  (1.651 m)     Head Circumference --      Peak Flow --      Pain Score 01/08/21 1728 10   Constitutional: Alert and oriented.  Eyes: Conjunctivae are normal.  ENT      Head: Normocephalic and atraumatic.      Nose: No congestion/rhinnorhea.      Mouth/Throat: Mucous membranes are moist.      Neck: No stridor. Hematological/Lymphatic/Immunilogical: No cervical lymphadenopathy. Cardiovascular: Normal rate, regular rhythm.  No murmurs, rubs, or gallops.  Respiratory: Normal respiratory effort without tachypnea nor retractions. Breath sounds are clear and equal bilaterally. No wheezes/rales/rhonchi. Gastrointestinal: Soft and tender to palpation in the epigastric region. Genitourinary: Deferred Musculoskeletal: Normal range of motion in all extremities. No lower extremity edema. Neurologic:  Normal speech and language. No gross focal neurologic deficits are appreciated.  Skin:  Skin is warm, dry and intact. No rash noted. Psychiatric: Mood and affect are normal. Speech and behavior are normal. Patient exhibits appropriate insight and judgment.  ____________________________________________    LABS (pertinent positives/negatives)  Lipase 30 CMP wnl CBC wbc 6.6, hgb 10.3, plt 402 UA cloudy, large hgb dipstick, trace luekocytes, >50 RBC and WBC Upreg negative ____________________________________________   EKG  None  ____________________________________________    RADIOLOGY  None  ____________________________________________   PROCEDURES  Procedures  ____________________________________________   INITIAL IMPRESSION / ASSESSMENT AND PLAN / ED COURSE  Pertinent labs & imaging results that were available during my care of the patient were reviewed by me and considered in my medical decision making (see chart for details).   Patient presented to the emergency department today because of concerns for nausea vomiting as  well as some abdominal pain.  Patient has history of gallstones.  I did discuss with the patient my concern for possible gallbladder etiology.  Did order patient medication to help with her symptoms as well as ultrasound.  However prior to ultrasound being performed I was notified by nursing staff that she had pulled her IV out and had vacated the room.  ____________________________________________   FINAL CLINICAL IMPRESSION(S) / ED DIAGNOSES  Final diagnoses:  Epigastric pain  Nausea and vomiting, unspecified vomiting type     Note: This dictation was prepared with Dragon dictation. Any transcriptional errors that result from this process are unintentional     13/10/22, MD 01/08/21 2043

## 2021-01-08 NOTE — ED Notes (Addendum)
When to pt's for meds and noticed that pt's IV was on ground. Went to lobby and called pt. No answer. MD and charge aware

## 2021-05-06 IMAGING — DX DG FINGER RING 2+V*R*
3 series · 3 of 3 positions shown · non-contrast
Comparison: None.

CLINICAL DATA: Ring finger amputation.

EXAM:
RIGHT RING FINGER 2+V

[finger ap]
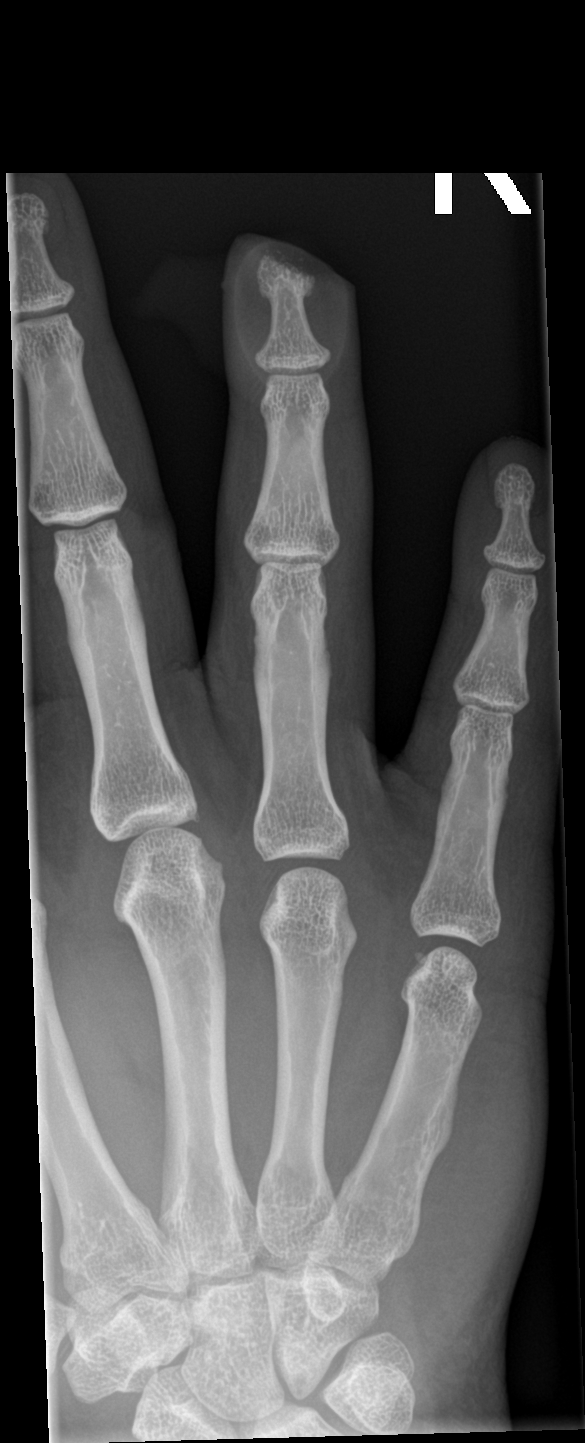

[finger obl]
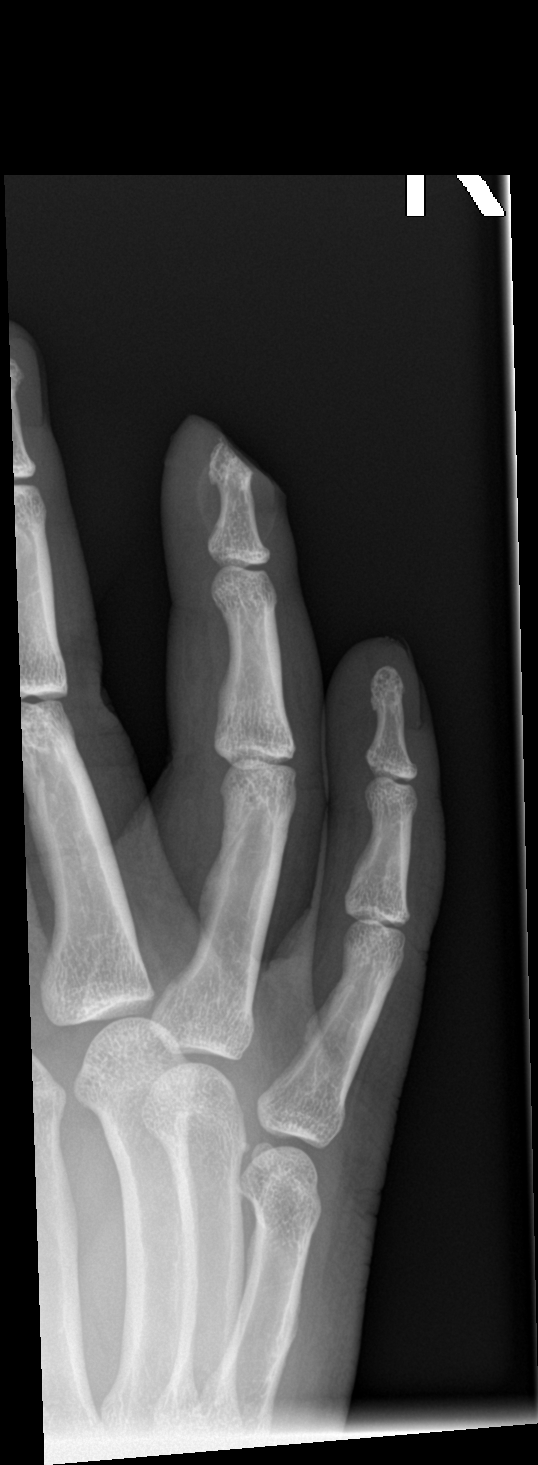

[finger lat]
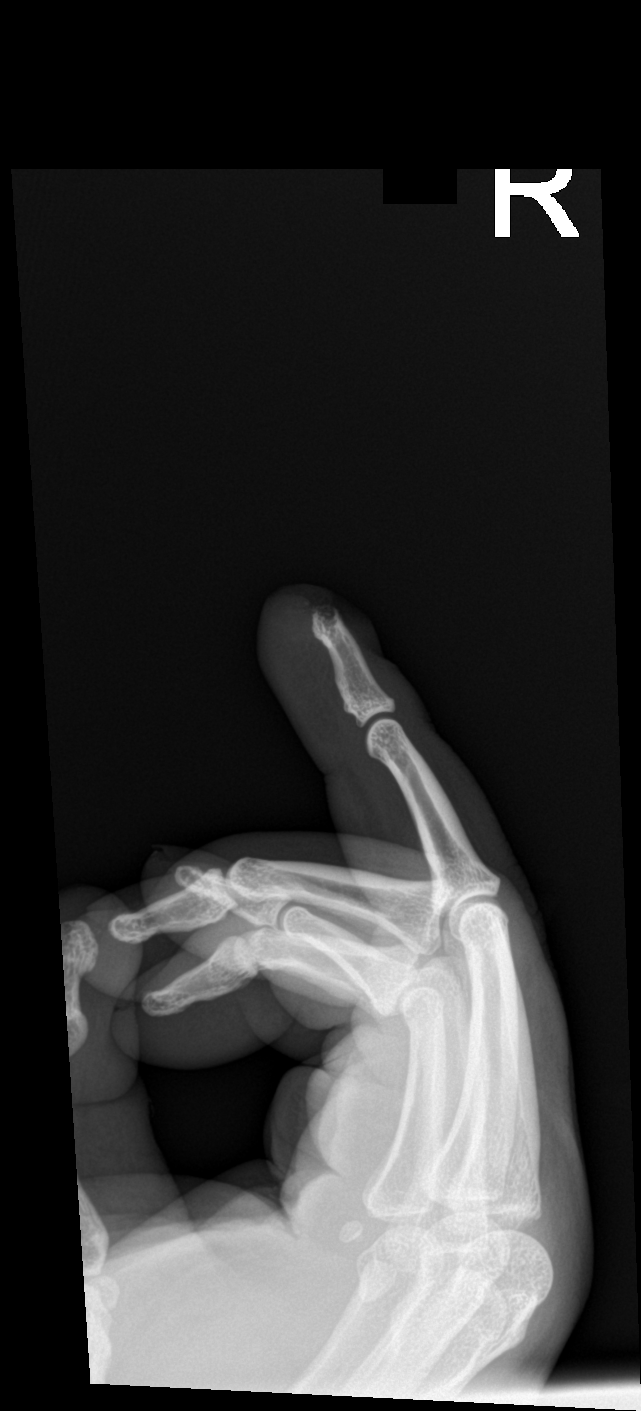

[3 of 3 positions shown; findings below may reference images not displayed]

FINDINGS: Three views study shows amputation involving the tip of the ring
finger, to involve the tuft of the distal phalanx. Very tiny bone
fragments are associated, but there is no evidence for retained
radiopaque soft tissue foreign body.
IMPRESSION: Distal ring finger amputation including the tuft of the distal
phalanx. No evidence for radiopaque soft tissue foreign body.

## 2021-06-20 ENCOUNTER — Other Ambulatory Visit: Payer: Self-pay

## 2021-06-20 ENCOUNTER — Emergency Department
Admission: EM | Admit: 2021-06-20 | Discharge: 2021-06-20 | Disposition: A | Discharge status: Home / Self Care | Payer: Medicaid Other | Attending: Emergency Medicine | Admitting: Emergency Medicine

## 2021-06-20 DIAGNOSIS — R102 Pelvic and perineal pain: Secondary | ICD-10-CM | POA: Diagnosis present

## 2021-06-20 DIAGNOSIS — B3731 Acute candidiasis of vulva and vagina: Secondary | ICD-10-CM | POA: Insufficient documentation

## 2021-06-20 LAB — URINALYSIS, ROUTINE W REFLEX MICROSCOPIC
Bilirubin Urine: NEGATIVE
Glucose, UA: NEGATIVE mg/dL
Hgb urine dipstick: NEGATIVE
Ketones, ur: NEGATIVE mg/dL
Leukocytes,Ua: NEGATIVE
Nitrite: NEGATIVE
Protein, ur: NEGATIVE mg/dL
Specific Gravity, Urine: 1.027 (ref 1.005–1.030)
pH: 5 (ref 5.0–8.0)

## 2021-06-20 LAB — WET PREP, GENITAL
Clue Cells Wet Prep HPF POC: NONE SEEN
Sperm: NONE SEEN
Trich, Wet Prep: NONE SEEN
WBC, Wet Prep HPF POC: <10 — AB (ref <10)

## 2021-06-20 LAB — POC URINE PREG, ED: Preg Test, Ur: NEGATIVE

## 2021-06-20 MED ORDER — FLUCONAZOLE 150 MG PO TABS: 150.0000 mg | ORAL_TABLET | Freq: Every day | ORAL | 1 refills | Status: DC

## 2021-06-20 NOTE — Discharge Instructions (Signed)
Take Diflucan one time. Repeat treatment in 72 hours if still symptomatic.  ?

## 2021-06-20 NOTE — ED Triage Notes (Signed)
Pt states she is having clitoral burning that began on Thursday. Pt denies discharge, vaginal odor, back pain, fever and dysuria.  ?

## 2021-06-20 NOTE — ED Provider Notes (Signed)
? ?[facility] ?Provider Note ? ?Patient Contact: 11:21 PM (approximate) ? ? ?History  ? ?vaginal buring ? ? ?HPI ? ?Jeanette Alexander is a 31 y.o. female presents to the emergency department with burning vaginal pain and pruritus for the past 2 to 3 days.  No changes in vaginal discharge.  No increased urinary frequency or hematuria.  No low back pain or fever. ? ?  ? ? ?Physical Exam  ? ?Triage Vital Signs: ?ED Triage Vitals  ?Enc Vitals Group  ?   BP 06/20/21 2124 (!) 145/82  ?   Pulse Rate 06/20/21 2124 62  ?   Resp 06/20/21 2124 16  ?   Temp 06/20/21 2124 98 ?F (36.7 ?C)  ?   Temp Source 06/20/21 2124 Oral  ?   SpO2 06/20/21 2124 96 %  ?   Weight 06/20/21 2125 240 lb (108.9 kg)  ?   Height 06/20/21 2125 5\' 4"  (1.626 m)  ?   Head Circumference --   ?   Peak Flow --   ?   Pain Score 06/20/21 2124 0  ?   Pain Loc --   ?   Pain Edu? --   ?   Excl. in GC? --   ? ? ?Most recent vital signs: ?Vitals:  ? 06/20/21 2124  ?BP: (!) 145/82  ?Pulse: 62  ?Resp: 16  ?Temp: 98 ?F (36.7 ?C)  ?SpO2: 96%  ? ? ? ?General: Alert and in no acute distress. ?Eyes:  PERRL. EOMI. ?Head: No acute traumatic findings ?ENT: ?     Ears:  ?     Nose: No congestion/rhinnorhea. ?     Mouth/Throat: Mucous membranes are moist. ?Neck: No stridor. No cervical spine tenderness to palpation. ?Cardiovascular:  Good peripheral perfusion ?Respiratory: Normal respiratory effort without tachypnea or retractions. Lungs CTAB. Good air entry to the bases with no decreased or absent breath sounds. ?Gastrointestinal: Bowel sounds ?4 quadrants. Soft and nontender to palpation. No guarding or rigidity. No palpable masses. No distention. No CVA tenderness. ?Musculoskeletal: Full range of motion to all extremities.  ?Neurologic:  No gross focal neurologic deficits are appreciated.  ?Skin:   No rash noted ?Other: ? ? ?ED Results / Procedures / Treatments  ? ?Labs ?(all labs ordered are listed, but only abnormal results are displayed) ?Labs  Reviewed  ?WET PREP, GENITAL - Abnormal; Notable for the following components:  ?    Result Value  ? Yeast Wet Prep HPF POC PRESENT (*)   ? WBC, Wet Prep HPF POC <10 (*)   ? All other components within normal limits  ?URINALYSIS, ROUTINE W REFLEX MICROSCOPIC - Abnormal; Notable for the following components:  ? Color, Urine YELLOW (*)   ? APPearance CLEAR (*)   ? All other components within normal limits  ?CHLAMYDIA/NGC RT PCR ([facility] ONLY)            ?POC URINE PREG, ED  ? ? ? ?PROCEDURES: ? ?Critical Care performed: No ? ?Procedures ? ? ?MEDICATIONS ORDERED IN ED: ?Medications - No data to display ? ? ?IMPRESSION / MDM / ASSESSMENT AND PLAN / ED COURSE  ?I reviewed the triage vital signs and the nursing notes. ?             ?               ?Assessment and plan:  ?Yeast vaginitis:  ?Differential diagnosis includes, but is not limited to, yeast vaginitis ?31 year old female presents to  the emergency department with vaginal pruritus.  Wet prep was positive for yeast.  We will treat with Diflucan. ?  ? ? ?FINAL CLINICAL IMPRESSION(S) / ED DIAGNOSES  ? ?Final diagnoses:  ?Yeast vaginitis  ? ? ? ?Rx / DC Orders  ? ?ED Discharge Orders   ? ?      Ordered  ?  fluconazole (DIFLUCAN) 150 MG tablet  Daily       ? 06/20/21 2318  ? ?  ?  ? ?  ? ? ? ?Note:  This document was prepared using Dragon voice recognition software and may include unintentional dictation errors. ?  ?Orvil Feil, [place] ?06/20/21 2323 ? ?  ?Merwyn Katos, MD ?06/27/21 1258 ? ?

## 2021-06-20 NOTE — ED Notes (Signed)
DC instructions discussed as well as follow up, discussed prescriptions as well.  ?

## 2021-06-21 LAB — CHLAMYDIA/NGC RT PCR (ARMC ONLY)
Chlamydia Tr: NOT DETECTED
N gonorrhoeae: NOT DETECTED

## 2021-10-29 IMAGING — CR DG CHEST 2V
1 series · 2 of 2 positions shown · non-contrast
Comparison: 04/23/2018

CLINICAL DATA: Fever, cough

EXAM:
CHEST - 2 VIEW

[Series 1: dg chest 2 view · 0.14mm/px · 2 of 2 slices shown]
[im 1/2]
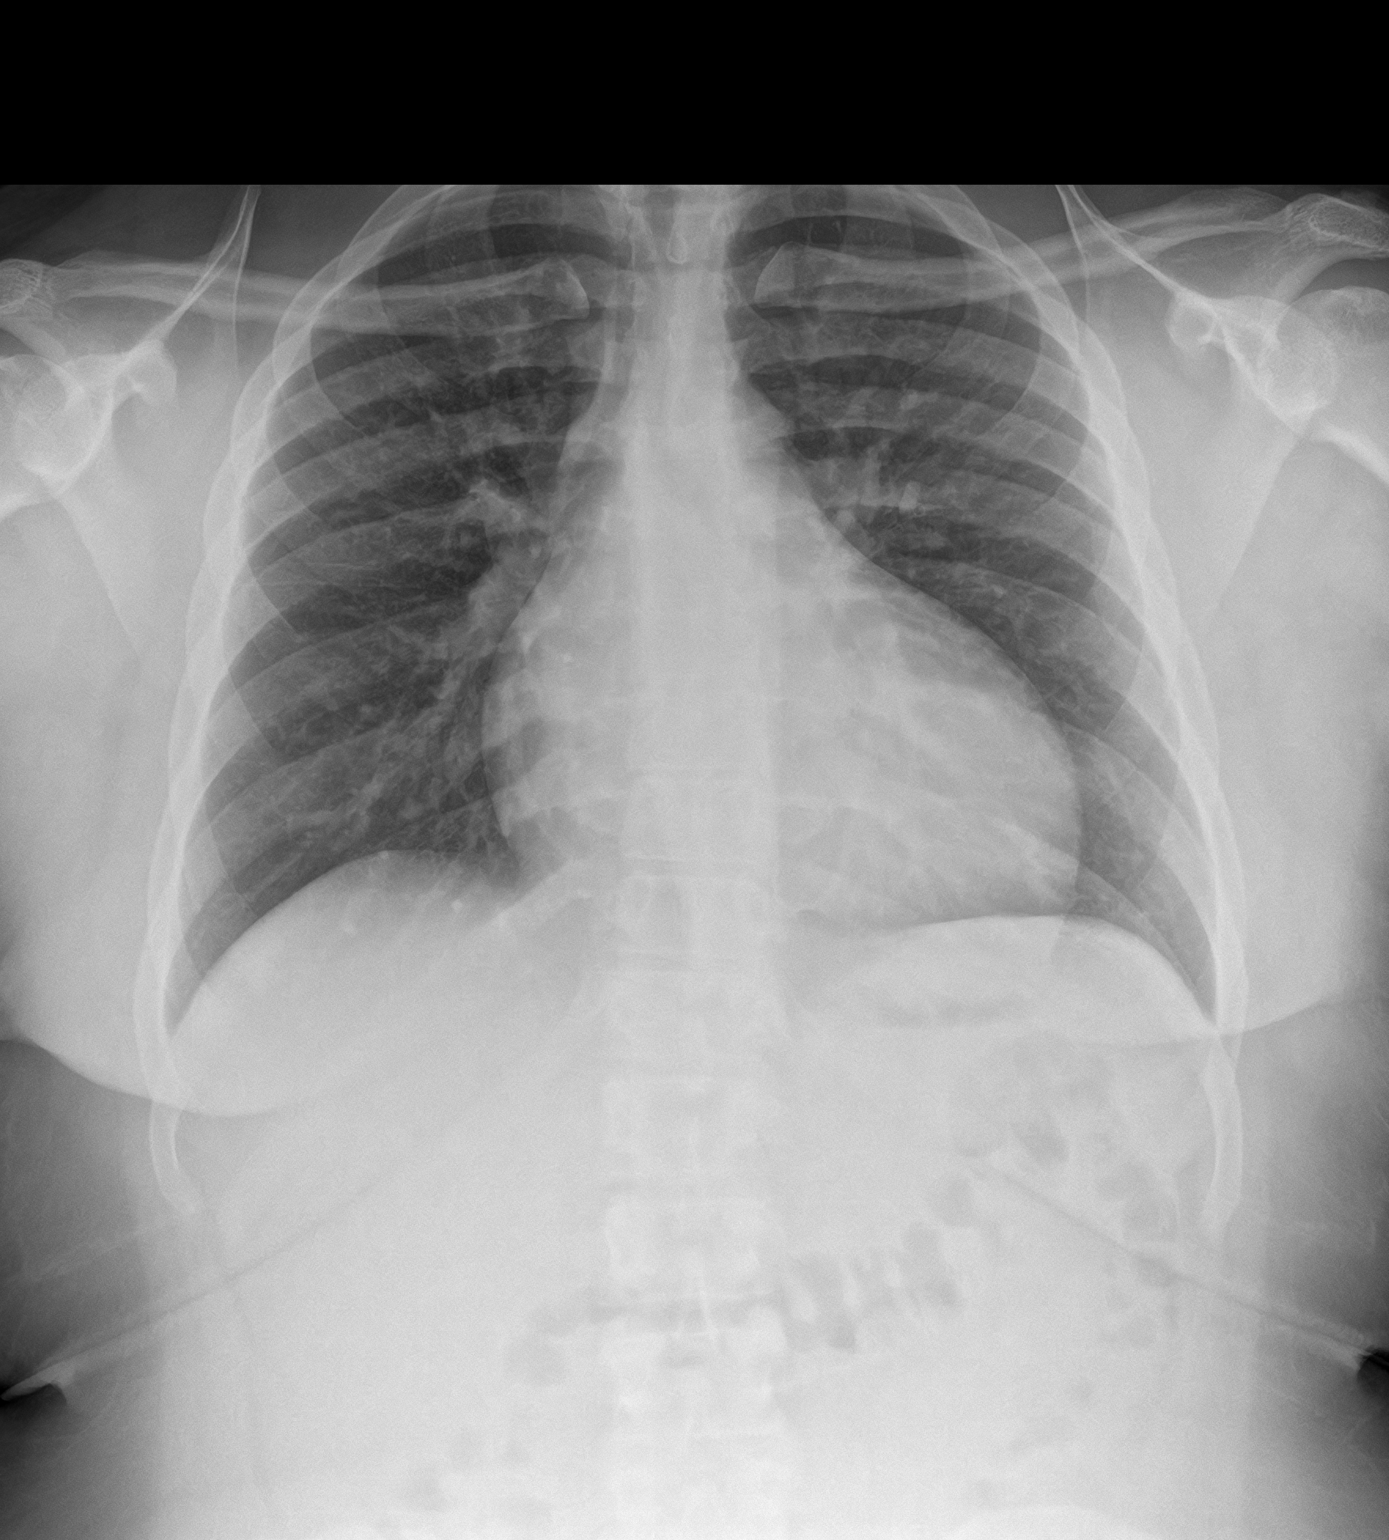
[im 2/2]
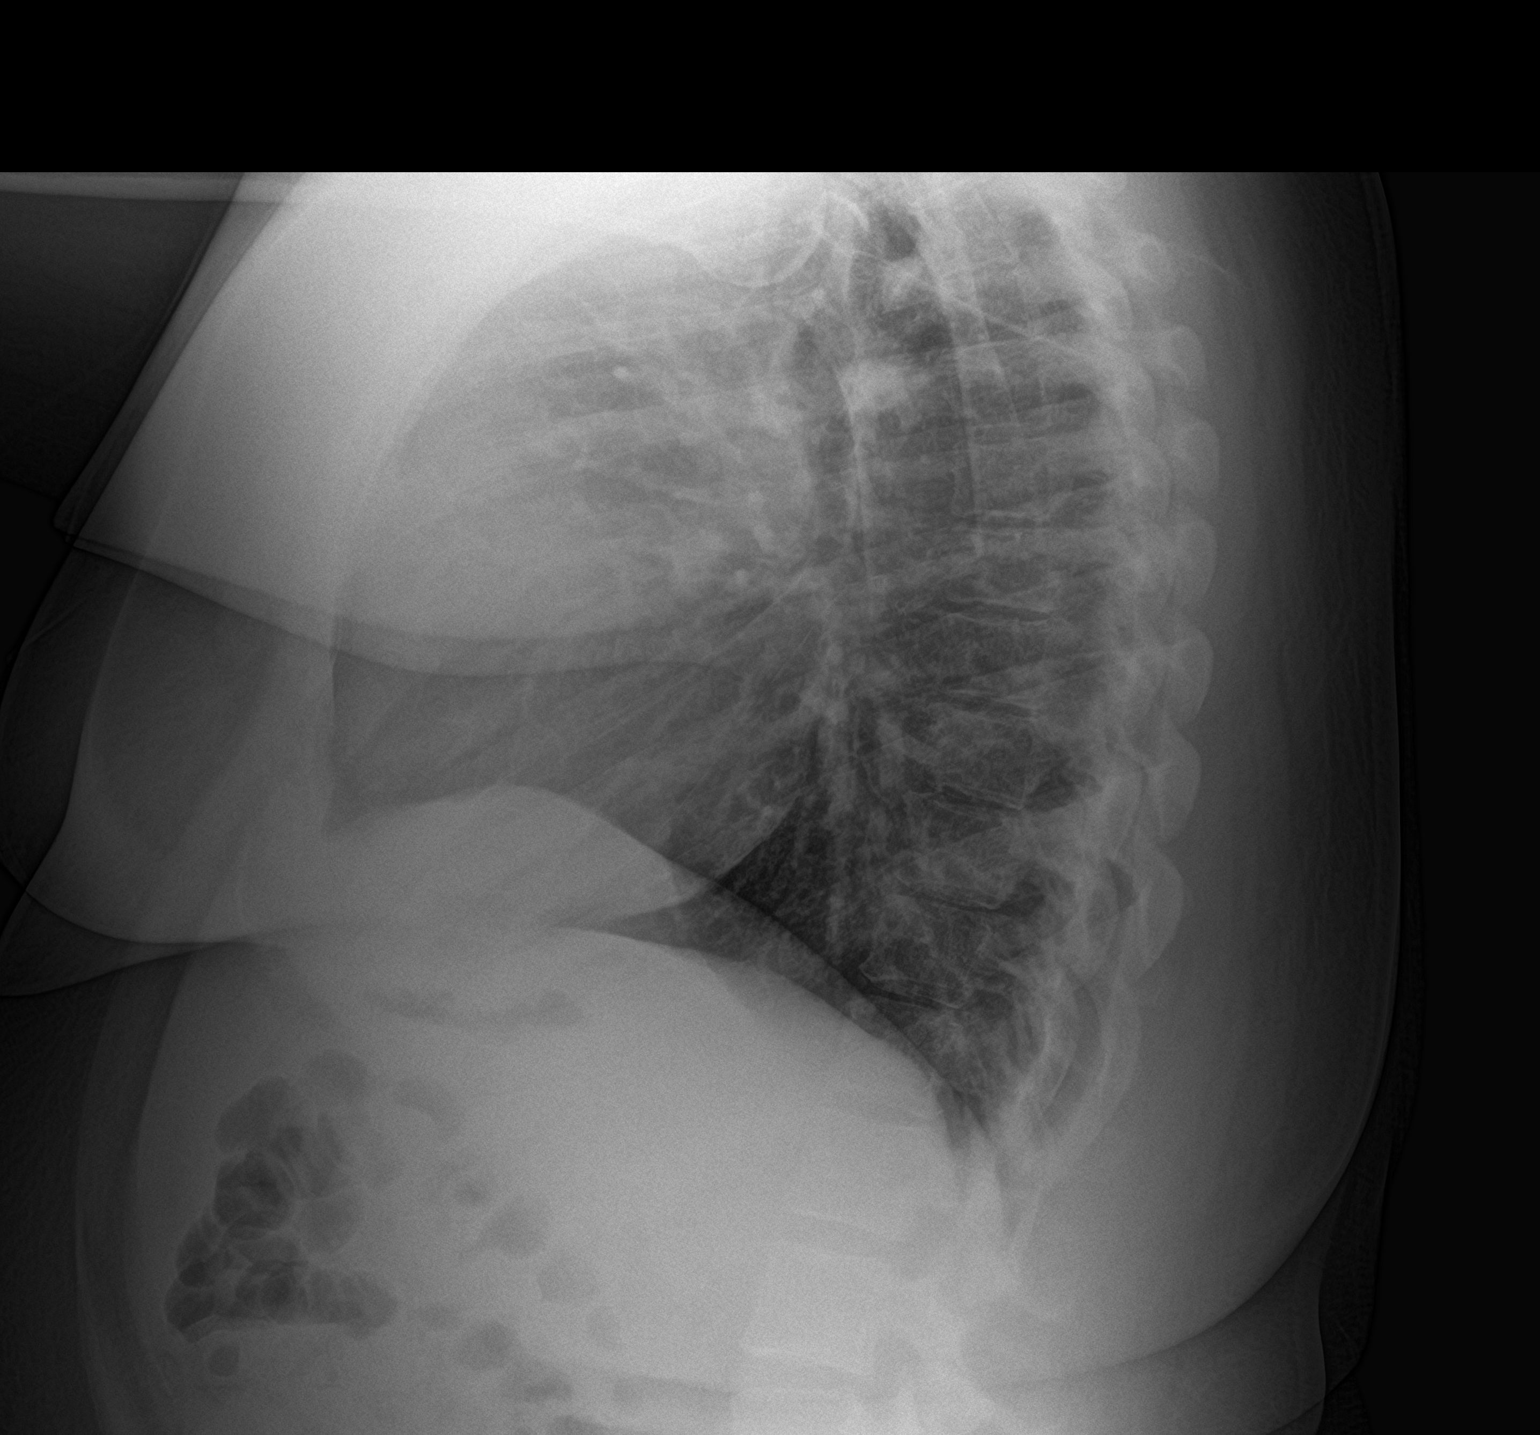

[2 of 2 positions shown; findings below may reference images not displayed]

FINDINGS: The heart size and mediastinal contours are within normal limits.
Both lungs are clear. The visualized skeletal structures are
unremarkable.
IMPRESSION: No active cardiopulmonary disease.

## 2022-07-31 ENCOUNTER — Emergency Department
Admission: EM | Admit: 2022-07-31 | Discharge: 2022-07-31 | Disposition: A | Discharge status: Home / Self Care | Payer: Medicaid Other | Attending: Emergency Medicine | Admitting: Emergency Medicine

## 2022-07-31 ENCOUNTER — Other Ambulatory Visit: Payer: Self-pay

## 2022-07-31 DIAGNOSIS — R197 Diarrhea, unspecified: Secondary | ICD-10-CM | POA: Insufficient documentation

## 2022-07-31 DIAGNOSIS — I10 Essential (primary) hypertension: Secondary | ICD-10-CM | POA: Diagnosis not present

## 2022-07-31 DIAGNOSIS — R5383 Other fatigue: Secondary | ICD-10-CM | POA: Insufficient documentation

## 2022-07-31 DIAGNOSIS — R519 Headache, unspecified: Secondary | ICD-10-CM | POA: Diagnosis not present

## 2022-07-31 DIAGNOSIS — R112 Nausea with vomiting, unspecified: Secondary | ICD-10-CM | POA: Diagnosis present

## 2022-07-31 DIAGNOSIS — J111 Influenza due to unidentified influenza virus with other respiratory manifestations: Secondary | ICD-10-CM

## 2022-07-31 LAB — COMPREHENSIVE METABOLIC PANEL
ALT: 13 U/L (ref 0–44)
AST: 19 U/L (ref 15–41)
Albumin: 4.3 g/dL (ref 3.5–5.0)
Alkaline Phosphatase: 38 U/L (ref 38–126)
Anion gap: 8 (ref 5–15)
BUN: 13 mg/dL (ref 6–20)
CO2: 22 mmol/L (ref 22–32)
Calcium: 9 mg/dL (ref 8.9–10.3)
Chloride: 106 mmol/L (ref 98–111)
Creatinine, Ser: 0.62 mg/dL (ref 0.44–1.00)
GFR, Estimated: >60 mL/min (ref >60)
Glucose, Bld: 93 mg/dL (ref 70–99)
Potassium: 3.8 mmol/L (ref 3.5–5.1)
Sodium: 136 mmol/L (ref 135–145)
Total Bilirubin: 0.6 mg/dL (ref 0.3–1.2)
Total Protein: 7.3 g/dL (ref 6.5–8.1)

## 2022-07-31 LAB — CBC
HCT: 31.9 % — ABNORMAL LOW (ref 36.0–46.0)
Hemoglobin: 10.1 g/dL — ABNORMAL LOW (ref 12.0–15.0)
MCH: 22.3 pg — ABNORMAL LOW (ref 26.0–34.0)
MCHC: 31.7 g/dL (ref 30.0–36.0)
MCV: 70.6 fL — ABNORMAL LOW (ref 80.0–100.0)
Platelets: 368 10*3/uL (ref 150–400)
RBC: 4.52 MIL/uL (ref 3.87–5.11)
RDW: 19.2 % — ABNORMAL HIGH (ref 11.5–15.5)
WBC: 5.9 10*3/uL (ref 4.0–10.5)
nRBC: 0 % (ref 0.0–0.2)

## 2022-07-31 LAB — LIPASE, BLOOD: Lipase: 41 U/L (ref 11–51)

## 2022-07-31 LAB — HCG, QUANTITATIVE, PREGNANCY: hCG, Beta Chain, Quant, S: <1 m[IU]/mL (ref <5)

## 2022-07-31 MED ORDER — NAPROXEN 500 MG PO TABS: 500.0000 mg | ORAL_TABLET | Freq: Two times a day (BID) | ORAL | 0 refills | Status: DC

## 2022-07-31 MED ORDER — ACETAMINOPHEN 500 MG PO TABS
1000.0000 mg | ORAL_TABLET | Freq: Once | ORAL | Status: AC
  Administered 2022-07-31: 1000 mg via ORAL
  Filled 2022-07-31: qty 2

## 2022-07-31 MED ORDER — IBUPROFEN 600 MG PO TABS
600.0000 mg | ORAL_TABLET | Freq: Once | ORAL | Status: AC
  Administered 2022-07-31: 600 mg via ORAL
  Filled 2022-07-31: qty 1

## 2022-07-31 MED ORDER — ONDANSETRON 4 MG PO TBDP: 4.0000 mg | ORAL_TABLET | Freq: Three times a day (TID) | ORAL | 0 refills | Status: DC | PRN

## 2022-07-31 MED ORDER — ONDANSETRON 4 MG PO TBDP
8.0000 mg | ORAL_TABLET | Freq: Once | ORAL | Status: AC
  Administered 2022-07-31: 8 mg via ORAL
  Filled 2022-07-31: qty 2

## 2022-07-31 NOTE — ED Triage Notes (Signed)
Pt to ed from home for N/V/D x 3 days. Pt is caox4, in no acute distress and ambulatory in triage.

## 2022-07-31 NOTE — ED Provider Notes (Signed)
[facility] Provider Note    Event Date/Time   First MD Initiated Contact with Patient 07/31/22 1945     (approximate)   History   Chief Complaint: Emesis   HPI  Jeanette Alexander is a 32 y.o. female with a history of hypertension who comes ED complaining of nausea vomiting diarrhea for the past 3 days, gradual onset, associated with generalized headache, fatigue, body aches, chills.  Her grandfather was sick recently with a viral illness.  No shortness of breath.     Physical Exam   Triage Vital Signs: ED Triage Vitals [07/31/22 1845]  Enc Vitals Group     BP (!) 168/94     Pulse Rate 80     Resp 16     Temp 98.4 F (36.9 C)     Temp Source Oral     SpO2 100 %     Weight 245 lb (111.1 kg)     Height 5\' 4"  (1.626 m)     Head Circumference      Peak Flow      Pain Score 0     Pain Loc      Pain Edu?      Excl. in GC?     Most recent vital signs: Vitals:   07/31/22 2032 07/31/22 2033  BP: 135/75   Pulse:  85  Resp:    Temp:    SpO2:  100%    General: Awake, no distress.  CV:  Good peripheral perfusion.  Regular rate rhythm Resp:  Normal effort.  Clear to auscultation bilaterally Abd:  No distention.  Soft nontender Other:  Moist oral mucosa   ED Results / Procedures / Treatments   Labs (all labs ordered are listed, but only abnormal results are displayed) Labs Reviewed  CBC - Abnormal; Notable for the following components:      Result Value   Hemoglobin 10.1 (*)    HCT 31.9 (*)    MCV 70.6 (*)    MCH 22.3 (*)    RDW 19.2 (*)    All other components within normal limits  LIPASE, BLOOD  COMPREHENSIVE METABOLIC PANEL  URINALYSIS, ROUTINE W REFLEX MICROSCOPIC  HCG, QUANTITATIVE, PREGNANCY  POC URINE PREG, ED     EKG    RADIOLOGY    PROCEDURES:  Procedures   MEDICATIONS ORDERED IN ED: Medications  ondansetron (ZOFRAN-ODT) disintegrating tablet 8 mg (8 mg Oral Given 07/31/22 2027)  acetaminophen (TYLENOL)  tablet 1,000 mg (1,000 mg Oral Given 07/31/22 2029)  ibuprofen (ADVIL) tablet 600 mg (600 mg Oral Given 07/31/22 2028)     IMPRESSION / MDM / ASSESSMENT AND PLAN / ED COURSE  I reviewed the triage vital signs and the nursing notes.  DDx: Influenza-like illness, dehydration, AKI, electrolyte abnormality, pancreatitis  Patient's presentation is most consistent with acute presentation with potential threat to life or bodily function.  Patient presents with constellation of symptoms consistent with an influenza-like illness.  She is nontoxic, vital signs are normal.  She was given oral Zofran, felt much better and tolerated oral food and fluids.  Will continue NSAIDs and Zofran for supportive care at home, stable for discharge.   Considering the patient's symptoms, medical history, and physical examination today, I have low suspicion for cholecystitis or biliary pathology, pancreatitis, perforation or bowel obstruction, hernia, intra-abdominal abscess, AAA or dissection, volvulus or intussusception, mesenteric ischemia, or appendicitis.  Symptoms not consistent with pregnancy PID or ectopic.  FINAL CLINICAL IMPRESSION(S) / ED DIAGNOSES   Final diagnoses:  Influenza-like illness     Rx / DC Orders   ED Discharge Orders          Ordered    ondansetron (ZOFRAN-ODT) 4 MG disintegrating tablet  Every 8 hours PRN        07/31/22 2145    naproxen (NAPROSYN) 500 MG tablet  2 times daily with meals        07/31/22 2145             Note:  This document was prepared using Dragon voice recognition software and may include unintentional dictation errors.   Sharman Cheek, MD 07/31/22 2150

## 2022-07-31 NOTE — ED Notes (Signed)
Pt tolerating graham crackers and drank 250 mLs of water.

## 2022-09-20 ENCOUNTER — Emergency Department
Admission: EM | Admit: 2022-09-20 | Discharge: 2022-09-20 | Disposition: A | Discharge status: Home / Self Care | Payer: Medicaid Other | Source: Home / Self Care | Attending: Emergency Medicine | Admitting: Emergency Medicine

## 2022-09-20 ENCOUNTER — Other Ambulatory Visit: Payer: Self-pay

## 2022-09-20 ENCOUNTER — Encounter: Payer: Self-pay | Admitting: Emergency Medicine

## 2022-09-20 DIAGNOSIS — R112 Nausea with vomiting, unspecified: Secondary | ICD-10-CM | POA: Diagnosis not present

## 2022-09-20 DIAGNOSIS — R1013 Epigastric pain: Secondary | ICD-10-CM | POA: Insufficient documentation

## 2022-09-20 LAB — URINALYSIS, ROUTINE W REFLEX MICROSCOPIC
Bilirubin Urine: NEGATIVE
Glucose, UA: NEGATIVE mg/dL
Hgb urine dipstick: NEGATIVE
Ketones, ur: NEGATIVE mg/dL
Leukocytes,Ua: NEGATIVE
Nitrite: NEGATIVE
Protein, ur: 100 mg/dL — AB
Specific Gravity, Urine: 1.024 (ref 1.005–1.030)
pH: 8 (ref 5.0–8.0)

## 2022-09-20 LAB — CBC
HCT: 31.3 % — ABNORMAL LOW (ref 36.0–46.0)
Hemoglobin: 10.1 g/dL — ABNORMAL LOW (ref 12.0–15.0)
MCH: 22.3 pg — ABNORMAL LOW (ref 26.0–34.0)
MCHC: 32.3 g/dL (ref 30.0–36.0)
MCV: 69.1 fL — ABNORMAL LOW (ref 80.0–100.0)
Platelets: 335 10*3/uL (ref 150–400)
RBC: 4.53 MIL/uL (ref 3.87–5.11)
RDW: 18.7 % — ABNORMAL HIGH (ref 11.5–15.5)
WBC: 4.9 10*3/uL (ref 4.0–10.5)
nRBC: 0 % (ref 0.0–0.2)

## 2022-09-20 LAB — COMPREHENSIVE METABOLIC PANEL
ALT: 18 U/L (ref 0–44)
AST: 22 U/L (ref 15–41)
Albumin: 4.4 g/dL (ref 3.5–5.0)
Alkaline Phosphatase: 46 U/L (ref 38–126)
Anion gap: 8 (ref 5–15)
BUN: 8 mg/dL (ref 6–20)
CO2: 24 mmol/L (ref 22–32)
Calcium: 8.6 mg/dL — ABNORMAL LOW (ref 8.9–10.3)
Chloride: 103 mmol/L (ref 98–111)
Creatinine, Ser: 0.61 mg/dL (ref 0.44–1.00)
GFR, Estimated: >60 mL/min (ref >60)
Glucose, Bld: 103 mg/dL — ABNORMAL HIGH (ref 70–99)
Potassium: 3.5 mmol/L (ref 3.5–5.1)
Sodium: 135 mmol/L (ref 135–145)
Total Bilirubin: 0.3 mg/dL (ref 0.3–1.2)
Total Protein: 7.8 g/dL (ref 6.5–8.1)

## 2022-09-20 LAB — LIPASE, BLOOD: Lipase: 47 U/L (ref 11–51)

## 2022-09-20 LAB — PREGNANCY, URINE: Preg Test, Ur: NEGATIVE

## 2022-09-20 MED ORDER — ONDANSETRON 4 MG PO TBDP
8.0000 mg | ORAL_TABLET | Freq: Once | ORAL | Status: DC
  Filled 2022-09-20: qty 2

## 2022-09-20 MED ORDER — ONDANSETRON 4 MG PO TBDP
4.0000 mg | ORAL_TABLET | Freq: Once | ORAL | Status: AC
  Administered 2022-09-20: 4 mg via ORAL

## 2022-09-20 MED ORDER — DICYCLOMINE HCL 20 MG PO TABS: 20.0000 mg | ORAL_TABLET | Freq: Four times a day (QID) | ORAL | 0 refills | Status: DC | PRN

## 2022-09-20 MED ORDER — ALUM & MAG HYDROXIDE-SIMETH 200-200-20 MG/5ML PO SUSP
30.0000 mL | Freq: Once | ORAL | Status: AC
  Administered 2022-09-20: 30 mL via ORAL
  Filled 2022-09-20: qty 30

## 2022-09-20 MED ORDER — ONDANSETRON HCL 4 MG PO TABS: 4.0000 mg | ORAL_TABLET | Freq: Four times a day (QID) | ORAL | 0 refills | Status: DC | PRN

## 2022-09-20 MED ORDER — ONDANSETRON HCL 4 MG PO TABS: 4.0000 mg | ORAL_TABLET | Freq: Four times a day (QID) | ORAL | 0 refills | Status: AC | PRN

## 2022-09-20 NOTE — Discharge Instructions (Signed)
You were seen for your vomiting.  Your testing was overall reassuring.  I sent a prescription for nausea medicine and an abdominal cramping medicine that you can take as needed.  Follow your primary care doctor within a few days if your symptoms do not improve.  Return to the ER for any new or worsening symptoms.

## 2022-09-20 NOTE — ED Triage Notes (Signed)
Pt presents ambulatory to triage via POV with complaints of lower abdominal pain x 1 day with associated N/V. No meds taken PTA - rates the pain 8/10. A&Ox4 at this time. Denies CP or SOB.

## 2022-09-20 NOTE — ED Provider Notes (Signed)
[facility] Provider Note    Event Date/Time   First MD Initiated Contact with Patient 09/20/22 2146     (approximate)   History   Abdominal Pain   HPI  Jeanette Alexander is a 32 y.o. female presenting to the emergency department for evaluation of abdominal pain.  Earlier today, patient had onset of multiple episodes of nonbloody vomiting.  Following this, she began to develop some pain in her epigastric region.  No history of similar.  Denies pain throughout the remainder of her abdomen.  No known sick contacts.     Physical Exam   Triage Vital Signs: ED Triage Vitals  Encounter Vitals Group     BP 09/20/22 1933 (!) 150/78     Systolic BP Percentile --      Diastolic BP Percentile --      Pulse Rate 09/20/22 1933 77     Resp 09/20/22 1933 18     Temp 09/20/22 1933 98.3 F (36.8 C)     Temp src --      SpO2 09/20/22 1933 100 %     Weight 09/20/22 1936 247 lb 5.7 oz (112.2 kg)     Height 09/20/22 1936 5\' 4"  (1.626 m)     Head Circumference --      Peak Flow --      Pain Score 09/20/22 1932 8     Pain Loc --      Pain Education --      Exclude from Growth Chart --     Most recent vital signs: Vitals:   09/20/22 1933  BP: (!) 150/78  Pulse: 77  Resp: 18  Temp: 98.3 F (36.8 C)  SpO2: 100%     General: Awake, interactive  CV:  Regular rate, good peripheral perfusion.  Resp:  Lungs clear, unlabored respirations.  Abd:  Soft, nondistended, tenderness in the epigastric region, remainder of abdomen nontender, negative Murphy sign, no tenderness at Burney's point Neuro:  Symmetric facial movement, fluid speech   ED Results / Procedures / Treatments   Labs (all labs ordered are listed, but only abnormal results are displayed) Labs Reviewed  COMPREHENSIVE METABOLIC PANEL - Abnormal; Notable for the following components:      Result Value   Glucose, Bld 103 (*)    Calcium 8.6 (*)    All other components within normal limits  CBC -  Abnormal; Notable for the following components:   Hemoglobin 10.1 (*)    HCT 31.3 (*)    MCV 69.1 (*)    MCH 22.3 (*)    RDW 18.7 (*)    All other components within normal limits  URINALYSIS, ROUTINE W REFLEX MICROSCOPIC - Abnormal; Notable for the following components:   Color, Urine YELLOW (*)    APPearance CLOUDY (*)    Protein, ur 100 (*)    Bacteria, UA RARE (*)    All other components within normal limits  LIPASE, BLOOD  PREGNANCY, URINE     EKG EKG independently reviewed interpreted by myself (ER attending) demonstrates:    RADIOLOGY Imaging independently reviewed and interpreted by myself demonstrates:    PROCEDURES:  Critical Care performed: No  Procedures   MEDICATIONS ORDERED IN ED: Medications  alum & mag hydroxide-simeth (MAALOX/MYLANTA) 200-200-20 MG/5ML suspension 30 mL (30 mLs Oral Given 09/20/22 2212)  ondansetron (ZOFRAN-ODT) disintegrating tablet 4 mg (4 mg Oral Given 09/20/22 2212)     IMPRESSION / MDM / ASSESSMENT AND PLAN / ED COURSE  I reviewed the triage vital signs and the nursing notes.  Differential diagnosis includes, but is not limited to, gastritis, viral GI illness, UTI, pancreatitis, lower suspicion other acute intra-abdominal process based on exam  Patient's presentation is most consistent with acute presentation with potential threat to life or bodily function.  32 year old female presenting to the emergency department for evaluation of vomiting epigastric pain.  Abdominal exam overall reassuring.  Labs from triage with stable anemia, electrolytes without severe derangement.  Urine without evidence of infection.  Normal lipase.  UPT added.  Will treat symptomatically with Zofran and GI cocktail.  Do not think there is an indication for cross-sectional imaging currently.  Will plan for p.o. trial after medicines.  On reevaluation, patient feels much improved.  She is tolerating p.o.  Reports some mild abdominal cramping, but is  comfortable discharge home.  Will DC with prescription for Zofran and Bentyl.  Strict return precautions provided.  Patient discharged in stable condition.     FINAL CLINICAL IMPRESSION(S) / ED DIAGNOSES   Final diagnoses:  Nausea and vomiting, unspecified vomiting type     Rx / DC Orders   ED Discharge Orders          Ordered    ondansetron (ZOFRAN) 4 MG tablet  Every 6 hours PRN        09/20/22 2252    dicyclomine (BENTYL) 20 MG tablet  Every 6 hours PRN        09/20/22 2252             Note:  This document was prepared using Dragon voice recognition software and may include unintentional dictation errors.   Trinna Post, MD 09/20/22 [phone]

## 2023-05-31 ENCOUNTER — Emergency Department
Admission: EM | Admit: 2023-05-31 | Discharge: 2023-05-31 | Disposition: A | Discharge status: Home / Self Care | Attending: Emergency Medicine | Admitting: Emergency Medicine

## 2023-05-31 ENCOUNTER — Other Ambulatory Visit: Payer: Self-pay

## 2023-05-31 ENCOUNTER — Encounter: Payer: Self-pay | Admitting: Emergency Medicine

## 2023-05-31 ENCOUNTER — Emergency Department

## 2023-05-31 DIAGNOSIS — Y92410 Unspecified street and highway as the place of occurrence of the external cause: Secondary | ICD-10-CM | POA: Diagnosis not present

## 2023-05-31 DIAGNOSIS — I10 Essential (primary) hypertension: Secondary | ICD-10-CM | POA: Insufficient documentation

## 2023-05-31 DIAGNOSIS — M542 Cervicalgia: Secondary | ICD-10-CM | POA: Diagnosis not present

## 2023-05-31 DIAGNOSIS — R519 Headache, unspecified: Secondary | ICD-10-CM | POA: Diagnosis present

## 2023-05-31 MED ORDER — CYCLOBENZAPRINE HCL 5 MG PO TABS: 5.0000 mg | ORAL_TABLET | Freq: Three times a day (TID) | ORAL | 0 refills | Status: DC | PRN

## 2023-05-31 MED ORDER — CYCLOBENZAPRINE HCL 10 MG PO TABS
5.0000 mg | ORAL_TABLET | Freq: Once | ORAL | Status: AC
  Administered 2023-05-31: 5 mg via ORAL
  Filled 2023-05-31: qty 1

## 2023-05-31 MED ORDER — KETOROLAC TROMETHAMINE 30 MG/ML IJ SOLN
30.0000 mg | Freq: Once | INTRAMUSCULAR | Status: AC
  Administered 2023-05-31: 30 mg via INTRAMUSCULAR
  Filled 2023-05-31: qty 1

## 2023-05-31 NOTE — ED Notes (Signed)
 PT contact made and myself introduced. Pt is CAOx4, breathing normally, and normal in color. Pt is relaxing in bed holding and playing with her small child. Pt does not appear to be in any obvious distress at the moment.
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
## 2023-09-08 ENCOUNTER — Ambulatory Visit: Admitting: Family Medicine

## 2023-09-14 ENCOUNTER — Ambulatory Visit: Admitting: Family Medicine

## 2023-09-14 NOTE — Progress Notes (Deleted)
 New Patient Office Visit  Subjective   Patient ID: Jeanette Alexander, female    DOB: [birth year]  Age: 33 y.o. MRN: [id number]  CC: No chief complaint on file.   HPI Jeanette Alexander is a 33 year old female who presents to establish with [facility] at [facility].   CC: Patient here to establish care  Last PCP: [facility] [place]  Specialists: cards (2020)  PMHx: HTN, depression/anxiety   Outpatient Encounter Medications as of 09/14/2023  Medication Sig   cyclobenzaprine  (FLEXERIL ) 5 MG tablet Take 1 tablet (5 mg total) by mouth 3 (three) times daily as needed.   dicyclomine  (BENTYL ) 20 MG tablet Take 1 tablet (20 mg total) by mouth every 6 (six) hours as needed for up to 7 days for spasms.   fluconazole  (DIFLUCAN ) 150 MG tablet Take 1 tablet (150 mg total) by mouth daily.   hydrochlorothiazide (HYDRODIURIL) 12.5 MG tablet TAKE 1 TABLET BY MOUTH IN THE MORNING FOR BLOOD PRESSURE   HYDROcodone -acetaminophen  (NORCO) 5-325 MG tablet Take 1 tablet by mouth every 4 (four) hours as needed for moderate pain.   metoprolol succinate (TOPROL-XL) 25 MG 24 hr tablet TAKE 1 TABLET BY MOUTH EVERY DAY FOR BLOOD PRESSURE AND PALPITATIONS   naproxen  (NAPROSYN ) 500 MG tablet Take 1 tablet (500 mg total) by mouth 2 (two) times daily with a meal.   ondansetron  (ZOFRAN -ODT) 4 MG disintegrating tablet Take 1 tablet (4 mg total) by mouth every 8 (eight) hours as needed for nausea or vomiting.   No facility-administered encounter medications on file as of 09/14/2023.    Patient Active Problem List   Diagnosis Date Noted   H/O: HTN (hypertension) 09/03/2014   Depression, major, recurrent, moderate (HCC) 09/03/2014   H/O: obesity 09/03/2014   Episodic paroxysmal anxiety disorder 09/03/2014   SOB (shortness of breath) 10/16/2013   Heart palpitations 09/27/2013   Past Medical History:  Diagnosis Date   Acquired acanthosis nigricans    Allergic rhinitis, cause unspecified    Anemia,  unspecified    Bipolar disorder, unspecified (HCC)    Cough variant asthma    Dysthymic disorder    Essential hypertension, benign    Headache(784.0)    Hypertension    Major depressive disorder, recurrent episode, moderate (HCC)    Migraine, unspecified, without mention of intractable migraine without mention of status migrainosus    Obesity, unspecified    Other nonspecific abnormal finding of lung field    Panic disorder    Past Surgical History:  Procedure Laterality Date   CESAREAN SECTION     TONSILLECTOMY     Family History  Problem Relation Age of Onset   Hypertension Mother    Leukemia Mother    Diabetes Mother    Heart murmur Mother    Heart attack Mother    Heart failure Mother    Social History   Socioeconomic History   Marital status: Married    Spouse name: Not on file   Number of children: Not on file   Years of education: Not on file   Highest education level: Not on file  Occupational History   Not on file  Tobacco Use   Smoking status: Every Day    Types: Cigars   Smokeless tobacco: Never  Vaping Use   Vaping status: Never Used  Substance and Sexual Activity   Alcohol use: Yes    Comment: occassionally   Drug use: Yes    Types: Marijuana, Cocaine   Sexual  activity: Yes  Other Topics Concern   Not on file  Social History Narrative   Not on file   Social Drivers of Health   Financial Resource Strain: Not on file  Food Insecurity: Not on file  Transportation Needs: Not on file  Physical Activity: Not on file  Stress: Not on file  Social Connections: Not on file  Intimate Partner Violence: Not on file   Outpatient Medications Prior to Visit  Medication Sig Dispense Refill   cyclobenzaprine  (FLEXERIL ) 5 MG tablet Take 1 tablet (5 mg total) by mouth 3 (three) times daily as needed. 30 tablet 0   dicyclomine  (BENTYL ) 20 MG tablet Take 1 tablet (20 mg total) by mouth every 6 (six) hours as needed for up to 7 days for spasms. 25 tablet 0    fluconazole  (DIFLUCAN ) 150 MG tablet Take 1 tablet (150 mg total) by mouth daily. 1 tablet 1   hydrochlorothiazide (HYDRODIURIL) 12.5 MG tablet TAKE 1 TABLET BY MOUTH IN THE MORNING FOR BLOOD PRESSURE 30 tablet 0   HYDROcodone -acetaminophen  (NORCO) 5-325 MG tablet Take 1 tablet by mouth every 4 (four) hours as needed for moderate pain. 20 tablet 0   metoprolol succinate (TOPROL-XL) 25 MG 24 hr tablet TAKE 1 TABLET BY MOUTH EVERY DAY FOR BLOOD PRESSURE AND PALPITATIONS 30 tablet 0   naproxen  (NAPROSYN ) 500 MG tablet Take 1 tablet (500 mg total) by mouth 2 (two) times daily with a meal. 20 tablet 0   ondansetron  (ZOFRAN -ODT) 4 MG disintegrating tablet Take 1 tablet (4 mg total) by mouth every 8 (eight) hours as needed for nausea or vomiting. 20 tablet 0   No facility-administered medications prior to visit.   Allergies  Allergen Reactions   Depakote [Divalproex Sodium]    Tegretol [Carbamazepine]    Cepacol [Cetylpyridinium] Swelling    Pt reports swelling of the throat     ROS      Objective   There were no vitals filed for this visit.  GENERAL: Well-appearing, in NAD. Well nourished.  SKIN: Pink, warm and dry. No rash, lesion, ulceration, or ecchymoses.  Head: Normocephalic. NECK: Trachea midline. Full ROM w/o pain or tenderness. No lymphadenopathy.  EARS: Tympanic membranes are intact, translucent without bulging and without drainage. Appropriate landmarks visualized.  EYES: Conjunctiva clear without exudates. EOMI, PERRL, no drainage present.  NOSE: Septum midline w/o deformity. Nares patent, mucosa pink and non-inflamed w/o drainage. No sinus tenderness.  THROAT: Uvula midline. Oropharynx clear. Tonsils non-inflamed without exudate. Mucous membranes pink and moist.  RESPIRATORY: Chest wall symmetrical. Respirations even and non-labored. Breath sounds clear to auscultation bilaterally.  CARDIAC: S1, S2 present, regular rate and rhythm without murmur or gallops. Peripheral pulses  2+ bilaterally.  MSK: Muscle tone and strength appropriate for age. Joints w/o tenderness, redness, or swelling.  EXTREMITIES: Without clubbing, cyanosis, or edema.  NEUROLOGIC: No motor or sensory deficits. Steady, even gait. C2-C12 intact.  PSYCH/MENTAL STATUS: Alert, oriented x 3. Cooperative, appropriate mood and affect.       Assessment & Plan:   ***  No follow-ups on file.   Evalene Arts, FNP

## 2023-09-23 ENCOUNTER — Ambulatory Visit (INDEPENDENT_AMBULATORY_CARE_PROVIDER_SITE_OTHER): Admitting: Family Medicine

## 2023-09-23 VITALS — BP 150/89 | HR 73 | Resp 16 | Ht 63.0 in | Wt 245.2 lb

## 2023-09-23 DIAGNOSIS — I10 Essential (primary) hypertension: Secondary | ICD-10-CM | POA: Diagnosis not present

## 2023-09-23 DIAGNOSIS — Z114 Encounter for screening for human immunodeficiency virus [HIV]: Secondary | ICD-10-CM

## 2023-09-23 DIAGNOSIS — Z1159 Encounter for screening for other viral diseases: Secondary | ICD-10-CM

## 2023-09-23 DIAGNOSIS — Z7689 Persons encountering health services in other specified circumstances: Secondary | ICD-10-CM

## 2023-09-23 DIAGNOSIS — F1110 Opioid abuse, uncomplicated: Secondary | ICD-10-CM | POA: Diagnosis not present

## 2023-09-23 DIAGNOSIS — F331 Major depressive disorder, recurrent, moderate: Secondary | ICD-10-CM

## 2023-09-23 MED ORDER — AMLODIPINE BESYLATE 5 MG PO TABS: 5.0000 mg | ORAL_TABLET | Freq: Every day | ORAL | 1 refills | Status: AC

## 2023-09-23 MED ORDER — NALOXONE HCL 4 MG/0.1ML NA LIQD
1.0000 | Freq: Once | NASAL | 0 refills | Status: AC
Start: 2023-09-23 — End: 2023-09-23

## 2023-09-23 NOTE — Patient Instructions (Addendum)
 [facility] [address] [place], [address] [place], [place] [address] [phone]  [facility]  [address] [place], [place] [address] [email or web address] Phone: [phone] Fax: [phone]     Smoking Cessation: QuitlineNC 1-800-QUIT-NOW [phone]); Espaol: 1-855-Djelo-Ya (8-144-664-6430) [email or web address]-castaneda.info/      Below are some facilities that offer counseling services.  Please call to arrange an appointment with a counselor:                                                                    Behavioral Health/Counseling Services:  [facility]  Phone: [phone] [address] [place], [place] [address]  [facility] Phone: [phone] Various locations   [facility] Outpatient Office  Phone: [phone] [address], [address] [place], [place] [address]  [facility]) Phone: [phone]  [facility] Phone: [phone] [address] [place], [place] [address]   Mental Health- Accepts Medicaid  (* = Spanish available;  + = Psychiatric services) * [facility]                            [phone]  Walk in 9am-1pm Virtual & Onsite  *+ [facility]:                                     [phone] or [phone] Virtual & Onsite  [facility]:                                              [phone] Virtual & Onsite  + [facility]:                                         [phone] Virtual & Onsite  * [facility]:                                                   [phone]   [facility]                                                    [phone] Virtual & Onsite  [facility]           [phone] Virtual   [facility]:  [phone] Virtual & Onsite  [facility]:                                      [phone] Virtual & Onsite  [facility]:                            [phone]   *[facility]                                                [phone] Virtual & Onsite  + [facility]:             [phone] or [phone]   [facility]                                                 [phone] Virtual & Onsite   _______________________________________________________________________________________   Rosine can also visit PsychologyToday.com to find a counselor that is within your insurance's network.  Website: https://www.psychologytoday.com/us /therapists   If you need immediate emotional support, reach out to the national mental health hotline: 988. Whether you're facing mental health struggles, emotional distress, alcohol or drug use concerns, or just need someone to talk to, our caring counselors are here for you. You are not alone.  _______________________________________________________________________________________                                                                                                                                                                                                                                                                                                       Alcohol & Drug Treatment Centers:  [facility] Phone: [phone] [address] [place], [place]  [address]  Alcoholics Anonymous  You can google meeting guide to find local & free meetings   [facility]  Phone: [phone] [address] [place], [place] [address]  [facility] Phone: [phone] [address] [place], [place] [address]  [facility]:                                [phone]  Alcoholics Anonymous:      [phone]   Narcotics Anonymous:       [phone]  Quit Smoking Hotline:         800-QUIT-NOW [phone])

## 2023-09-23 NOTE — Progress Notes (Signed)
 New Patient Office Visit  Subjective   Patient ID: Jeanette Alexander, female    DOB: 1990/07/12  Age: 33 y.o. MRN: [id number]  CC:  Chief Complaint  Patient presents with   Establish Alexander    Left Hand 3rd digit has a spot maybe from cutting meat and its been sore x month.   HPI Jeanette Alexander is a 33 year old female who presents to establish with Jeanette [facility] at [facility].   CC: Patient here to establish Alexander  Last PCP:  Jeanette Alexander in [place]  Specialists: cards [phone])  PMHx: HTN, anxiety/depression/mood disorder, anemia, migraines (reports possibly related to HTN), obesity, gout   She reports she stopped taking her blood pressure medication.   OPIOID ABUSE: Reports she reports she has an issue with opioid abuse. She started about 3 years ago because of her ex-wife.  Daily- 80mg  oxycodone  or percocet (highest in one day was 110mg /day) not prescribed  She is interested in getting help and would like to stop taking this medication.   History of depression: not currently on medication      09/23/2023    9:44 AM  PHQ9 SCORE ONLY  PHQ-9 Total Score 18      09/23/2023    9:44 AM  GAD 7 : Generalized Anxiety Score  Nervous, Anxious, on Edge 3  Control/stop worrying 3  Worry too much - different things 3  Trouble relaxing 3  Restless 2  Easily annoyed or irritable 3  Afraid - awful might happen 1  Total GAD 7 Score 18    Outpatient Encounter Medications as of 09/23/2023  Medication Sig   amLODipine (NORVASC) 5 MG tablet Take 1 tablet (5 mg total) by mouth daily.   naloxone (NARCAN) nasal spray 4 mg/0.1 mL Place 1 spray into the nose once for 1 dose.   [DISCONTINUED] cyclobenzaprine  (FLEXERIL ) 5 MG tablet Take 1 tablet (5 mg total) by mouth 3 (three) times daily as needed.   [DISCONTINUED] dicyclomine  (BENTYL ) 20 MG tablet Take 1 tablet (20 mg total) by mouth every 6 (six) hours as needed for up to 7 days for spasms.   [DISCONTINUED] fluconazole  (DIFLUCAN )  150 MG tablet Take 1 tablet (150 mg total) by mouth daily.   [DISCONTINUED] hydrochlorothiazide (HYDRODIURIL) 12.5 MG tablet TAKE 1 TABLET BY MOUTH IN THE MORNING FOR BLOOD PRESSURE   [DISCONTINUED] HYDROcodone -acetaminophen  (NORCO) 5-325 MG tablet Take 1 tablet by mouth every 4 (four) hours as needed for moderate pain.   [DISCONTINUED] metoprolol succinate (TOPROL-XL) 25 MG 24 hr tablet TAKE 1 TABLET BY MOUTH EVERY DAY FOR BLOOD PRESSURE AND PALPITATIONS   [DISCONTINUED] naproxen  (NAPROSYN ) 500 MG tablet Take 1 tablet (500 mg total) by mouth 2 (two) times daily with a meal.   [DISCONTINUED] ondansetron  (ZOFRAN -ODT) 4 MG disintegrating tablet Take 1 tablet (4 mg total) by mouth every 8 (eight) hours as needed for nausea or vomiting.   No facility-administered encounter medications on file as of 09/23/2023.    Patient Active Problem List   Diagnosis Date Noted   H/O: HTN (hypertension) 09/03/2014   Depression, major, recurrent, moderate (HCC) 09/03/2014   H/O: obesity 09/03/2014   Episodic paroxysmal anxiety disorder 09/03/2014   SOB (shortness of breath) 10/16/2013   Heart palpitations 09/27/2013   Past Medical History:  Diagnosis Date   Acquired acanthosis nigricans    Allergic rhinitis, cause unspecified    Anemia, unspecified    Bipolar disorder, unspecified (HCC)    Cough variant asthma  Dysthymic disorder    Essential hypertension, benign    Headache(784.0)    Hypertension    Major depressive disorder, recurrent episode, moderate (HCC)    Migraine, unspecified, without mention of intractable migraine without mention of status migrainosus    Obesity, unspecified    Other nonspecific abnormal finding of lung field    Panic disorder    Substance abuse (HCC)    Past Surgical History:  Procedure Laterality Date   CESAREAN SECTION     TONSILLECTOMY     Family History  Problem Relation Age of Onset   Hypertension Mother    Leukemia Mother    Diabetes Mother    Heart  murmur Mother    Heart attack Mother    Heart failure Mother    Anxiety disorder Mother    Cancer Mother    Depression Mother    Hypertension Maternal Grandmother    Hypertension Maternal Grandfather    Social History   Socioeconomic History   Marital status: Married    Spouse name: Not on file   Number of children: Not on file   Years of education: Not on file   Highest education level: 12th grade  Occupational History   Not on file  Tobacco Use   Smoking status: Every Day    Types: Cigars    Passive exposure: Never   Smokeless tobacco: Never  Vaping Use   Vaping status: Never Used  Substance and Sexual Activity   Alcohol use: Yes    Alcohol/week: 10.0 standard drinks of alcohol    Types: 10 Cans of beer per week    Comment: occassionally   Drug use: Yes    Types: Hydrocodone , Marijuana, Oxycodone    Sexual activity: Yes    Birth control/protection: None  Other Topics Concern   Not on file  Social History Narrative   Not on file   Social Drivers of Health   Financial Resource Strain: Low Risk  (09/23/2023)   Overall Financial Resource Strain (CARDIA)    Difficulty of Paying Living Expenses: Not very hard  Food Insecurity: No Food Insecurity (09/23/2023)   Hunger Vital Sign    Worried About Running Out of Food in the Last Year: Never true    Ran Out of Food in the Last Year: Never true  Transportation Needs: No Transportation Needs (09/23/2023)   PRAPARE - Administrator, Civil Service (Medical): No    Lack of Transportation (Non-Medical): No  Physical Activity: Sufficiently Active (09/23/2023)   Exercise Vital Sign    Days of Exercise per Week: 6 days    Minutes of Exercise per Session: 100 min  Stress: Stress Concern Present (09/23/2023)   [employer] of Occupational Health - Occupational Stress Questionnaire    Feeling of Stress: To some extent  Social Connections: Moderately Isolated (09/23/2023)   Social Connection and Isolation Panel     Frequency of Communication with Friends and Family: More than three times a week    Frequency of Social Gatherings with Friends and Family: More than three times a week    Attends Religious Services: More than 4 times per year    Active Member of [employer] or Organizations: No    Attends Engineer, structural: Not on file    Marital Status: Divorced  Intimate Partner Violence: Not on file   Outpatient Medications Prior to Visit  Medication Sig Dispense Refill   cyclobenzaprine  (FLEXERIL ) 5 MG tablet Take 1 tablet (5 mg total) by mouth 3 (  three) times daily as needed. 30 tablet 0   dicyclomine  (BENTYL ) 20 MG tablet Take 1 tablet (20 mg total) by mouth every 6 (six) hours as needed for up to 7 days for spasms. 25 tablet 0   fluconazole  (DIFLUCAN ) 150 MG tablet Take 1 tablet (150 mg total) by mouth daily. 1 tablet 1   hydrochlorothiazide (HYDRODIURIL) 12.5 MG tablet TAKE 1 TABLET BY MOUTH IN THE MORNING FOR BLOOD PRESSURE 30 tablet 0   HYDROcodone -acetaminophen  (NORCO) 5-325 MG tablet Take 1 tablet by mouth every 4 (four) hours as needed for moderate pain. 20 tablet 0   metoprolol succinate (TOPROL-XL) 25 MG 24 hr tablet TAKE 1 TABLET BY MOUTH EVERY DAY FOR BLOOD PRESSURE AND PALPITATIONS 30 tablet 0   naproxen  (NAPROSYN ) 500 MG tablet Take 1 tablet (500 mg total) by mouth 2 (two) times daily with a meal. 20 tablet 0   ondansetron  (ZOFRAN -ODT) 4 MG disintegrating tablet Take 1 tablet (4 mg total) by mouth every 8 (eight) hours as needed for nausea or vomiting. 20 tablet 0   No facility-administered medications prior to visit.   Allergies  Allergen Reactions   Buspirone Dermatitis   Depakote [Divalproex Sodium]    Fluoxetine Itching   Tegretol [Carbamazepine]    Valproic Acid Hives   Cepacol [Cetylpyridinium] Swelling    Pt reports swelling of the throat   Cetylpyridinium Chloride Swelling    Pt reports swelling of the throat   ROS: see HPI    Objective   Today's Vitals    09/23/23 0936 09/23/23 1039  BP: (!) 147/90 (!) 150/89  Pulse: 73   Resp: 16   Weight: 245 lb 3.2 oz (111.2 kg)   Height: 5' 3 (1.6 m)   PainSc: 0-No pain     GENERAL: Well-appearing, in NAD. Well nourished.  SKIN: Pink, warm and dry. No rash, lesion, ulceration, or ecchymoses.  Head: Normocephalic. NECK: Trachea midline. Full ROM w/o pain or tenderness. No lymphadenopathy.  EARS: Tympanic membranes are intact, translucent without bulging and without drainage. Appropriate landmarks visualized.  EYES: Conjunctiva clear without exudates. EOMI, PERRL, no drainage present.  NOSE: Septum midline w/o deformity. Nares patent, mucosa pink and non-inflamed w/o drainage. No sinus tenderness.  THROAT: Uvula midline. Oropharynx clear. Tonsils non-inflamed without exudate. Mucous membranes pink and moist.  RESPIRATORY: Chest wall symmetrical. Respirations even and non-labored. Breath sounds clear to auscultation bilaterally.  CARDIAC: S1, S2 present, regular rate and rhythm without murmur or gallops. Peripheral pulses 2+ bilaterally.  MSK: Muscle tone and strength appropriate for age. Joints w/o tenderness, redness, or swelling.  EXTREMITIES: Without clubbing, cyanosis, or edema.  NEUROLOGIC: No motor or sensory deficits. Steady, even gait. C2-C12 intact.  PSYCH/MENTAL STATUS: Alert, oriented x 3. Cooperative, appropriate mood and affect.     Assessment & Plan:   1. Encounter to establish Alexander (Primary) Patient is a 74- year-old female who presents today to establish Alexander with primary [facility] at [facility]. Reviewed the past medical history, family history, social history, surgical history, medications and allergies today- updates made as indicated. Patient has concerns today about current opioid abuse and is interested in receiving treatment.   2. Opioid abuse (HCC) Patient reports that she has experienced an opioid problem for the past few years and is interested in receiving assistance in weaning  off the medication. Patient is alert and answers all questions appropriately. Resources provided to patient- encouraged her to contact [facility] and/or [facility] for Suboxone treatment. Rx provided for narcan and discussed  risk of overdose with patient. Patient verbalized an understanding and will contact  - naloxone (NARCAN) nasal spray 4 mg/0.1 mL; Place 1 spray into the nose once for 1 dose.  Dispense: 1 each; Refill: 0  3. Depression, major, recurrent, moderate (HCC) PHQ9 completed with score of 18. Denies active or passive suicidal ideations.  GAD7 completed with score of 18. Denies issues with panic attacks, shortness of breath, difficulty breathing, palpitations, hyperventilation, and dizziness. Discussed benefits of cognitive behavioral therapy (CBT) and first-line pharmacotherapy concurrently. Patient would like to focus on opioid treatment at this time. She should receive counseling through one of these treatment centers. Will discuss more in-depth at upcoming visit.    4. Primary hypertension Patient presents today with slightly elevated blood pressure, repeat blood pressure still elevated. Patient in no acute distress and is well-appearing. Denies chest pain, shortness of breath, lower extremity edema. Reports occasional blurred vision and headaches. Cardiovascular exam with heart regular rate and rhythm. Normal heart sounds, no murmurs present. No lower extremity edema present. Lungs clear to auscultation bilaterally. Rx sent for amlodipine 5mg  daily Advised patient to closely monitor blood pressure and return to office sooner if blood pressure begins to increase greater than 130/80. Follow-up in 2 weeks.    - CBC with Differential/Platelet - Comprehensive metabolic panel with GFR  5. Morbidly obese (HCC) Will check hemoglobin A1c, fasting lipid panel and thyroid function.  - Hemoglobin A1c - Lipid panel - TSH Rfx on Abnormal to Free T4  6. Screening for HIV (human immunodeficiency  virus) The [place]  Preventive Services Task Force (USPSTF) recommends HIV screening for adults age 33-65. Will complete HIV screening with labs today and update patient with results.  - HIV antibody (with reflex)  7. Need for hepatitis C screening test The [place]  Preventive Services Task Force (USPSTF) recommends hepatitis C screening for adults age 33-79. Will complete hepatitis C screening with labs today and update patient with results.  - Hepatitis C Antibody   Return in about 2 weeks (around 10/07/2023) for HTN follow-up.   Evalene Arts, FNP

## 2023-09-24 LAB — COMPREHENSIVE METABOLIC PANEL WITH GFR
ALT: 12 IU/L (ref 0–32)
AST: 15 IU/L (ref 0–40)
Albumin: 4.2 g/dL (ref 3.9–4.9)
Alkaline Phosphatase: 47 IU/L (ref 44–121)
BUN/Creatinine Ratio: 17 (ref 9–23)
BUN: 11 mg/dL (ref 6–20)
Bilirubin Total: 0.3 mg/dL (ref 0.0–1.2)
CO2: 24 mmol/L (ref 20–29)
Calcium: 9.2 mg/dL (ref 8.7–10.2)
Chloride: 102 mmol/L (ref 96–106)
Creatinine, Ser: 0.63 mg/dL (ref 0.57–1.00)
Globulin, Total: 2.6 g/dL (ref 1.5–4.5)
Glucose: 80 mg/dL (ref 70–99)
Potassium: 4.5 mmol/L (ref 3.5–5.2)
Sodium: 137 mmol/L (ref 134–144)
Total Protein: 6.8 g/dL (ref 6.0–8.5)
eGFR: 120 mL/min/1.73 (ref >59)

## 2023-09-24 LAB — TSH RFX ON ABNORMAL TO FREE T4: TSH: 0.524 u[IU]/mL (ref 0.450–4.500)

## 2023-09-24 LAB — CBC WITH DIFFERENTIAL/PLATELET
Basophils Absolute: 0.1 x10E3/uL (ref 0.0–0.2)
Basos: 1 %
EOS (ABSOLUTE): 0.1 x10E3/uL (ref 0.0–0.4)
Eos: 3 %
Hematocrit: 33.6 % — ABNORMAL LOW (ref 34.0–46.6)
Hemoglobin: 10.2 g/dL — ABNORMAL LOW (ref 11.1–15.9)
Immature Grans (Abs): 0 x10E3/uL (ref 0.0–0.1)
Immature Granulocytes: 0 %
Lymphocytes Absolute: 1.6 x10E3/uL (ref 0.7–3.1)
Lymphs: 46 %
MCH: 24 pg — ABNORMAL LOW (ref 26.6–33.0)
MCHC: 30.4 g/dL — ABNORMAL LOW (ref 31.5–35.7)
MCV: 79 fL (ref 79–97)
Monocytes Absolute: 0.5 x10E3/uL (ref 0.1–0.9)
Monocytes: 12 %
Neutrophils Absolute: 1.4 x10E3/uL (ref 1.4–7.0)
Neutrophils: 38 %
Platelets: 392 x10E3/uL (ref 150–450)
RBC: 4.25 x10E6/uL (ref 3.77–5.28)
RDW: 17.1 % — ABNORMAL HIGH (ref 11.7–15.4)
WBC: 3.7 x10E3/uL (ref 3.4–10.8)

## 2023-09-24 LAB — HEMOGLOBIN A1C
Est. average glucose Bld gHb Est-mCnc: 105 mg/dL
Hgb A1c MFr Bld: 5.3 % (ref 4.8–5.6)

## 2023-09-24 LAB — HIV ANTIBODY (ROUTINE TESTING W REFLEX): HIV Screen 4th Generation wRfx: NONREACTIVE

## 2023-09-24 LAB — LIPID PANEL
Chol/HDL Ratio: 1.8 ratio (ref 0.0–4.4)
Cholesterol, Total: 163 mg/dL (ref 100–199)
HDL: 91 mg/dL (ref >39)
LDL Chol Calc (NIH): 64 mg/dL (ref 0–99)
Triglycerides: 33 mg/dL (ref 0–149)
VLDL Cholesterol Cal: 8 mg/dL (ref 5–40)

## 2023-09-24 LAB — HEPATITIS C ANTIBODY: Hep C Virus Ab: NONREACTIVE

## 2023-10-03 ENCOUNTER — Ambulatory Visit: Payer: Self-pay | Admitting: Family Medicine

## 2023-10-07 ENCOUNTER — Ambulatory Visit: Admitting: Family Medicine

## 2023-10-21 ENCOUNTER — Other Ambulatory Visit

## 2023-12-26 ENCOUNTER — Encounter: Admitting: Family Medicine
